# Patient Record
Sex: Female | Born: 1935 | Race: White | Hispanic: No | Smoking: Former smoker
Health system: Southern US, Community
[De-identification: ages and names within clinical notes are randomized; demographics above are authoritative.]

## PROBLEM LIST (undated history)

## (undated) DIAGNOSIS — F03A0 Unspecified dementia, mild, without behavioral disturbance, psychotic disturbance, mood disturbance, and anxiety: Secondary | ICD-10-CM

## (undated) DIAGNOSIS — F0390 Unspecified dementia without behavioral disturbance: Secondary | ICD-10-CM

## (undated) DIAGNOSIS — G2581 Restless legs syndrome: Secondary | ICD-10-CM

## (undated) DIAGNOSIS — I10 Essential (primary) hypertension: Secondary | ICD-10-CM

## (undated) DIAGNOSIS — I2510 Atherosclerotic heart disease of native coronary artery without angina pectoris: Secondary | ICD-10-CM

## (undated) DIAGNOSIS — E782 Mixed hyperlipidemia: Secondary | ICD-10-CM

## (undated) DIAGNOSIS — E039 Hypothyroidism, unspecified: Secondary | ICD-10-CM

## (undated) DIAGNOSIS — R42 Dizziness and giddiness: Secondary | ICD-10-CM

## (undated) HISTORY — PX: FOOT SURGERY: SHX648

## (undated) HISTORY — DX: Mixed hyperlipidemia: E78.2

## (undated) HISTORY — PX: EYE SURGERY: SHX253

## (undated) HISTORY — DX: Essential (primary) hypertension: I10

## (undated) HISTORY — DX: Hypothyroidism, unspecified: E03.9

## (undated) HISTORY — DX: Unspecified dementia without behavioral disturbance: F03.90

## (undated) HISTORY — DX: Dizziness and giddiness: R42

## (undated) HISTORY — DX: Unspecified dementia, mild, without behavioral disturbance, psychotic disturbance, mood disturbance, and anxiety: F03.A0

## (undated) HISTORY — DX: Atherosclerotic heart disease of native coronary artery without angina pectoris: I25.10

## (undated) HISTORY — DX: Restless legs syndrome: G25.81

---

## 2000-01-18 ENCOUNTER — Inpatient Hospital Stay (HOSPITAL_COMMUNITY): Admission: EM | Admit: 2000-01-18 | Discharge: 2000-01-19 | Payer: Self-pay | Admitting: Emergency Medicine

## 2000-01-18 ENCOUNTER — Encounter: Payer: Self-pay | Admitting: Emergency Medicine

## 2000-01-19 ENCOUNTER — Encounter: Payer: Self-pay | Admitting: *Deleted

## 2004-11-07 ENCOUNTER — Ambulatory Visit: Payer: Self-pay | Admitting: Ophthalmology

## 2004-11-14 ENCOUNTER — Ambulatory Visit: Payer: Self-pay | Admitting: Ophthalmology

## 2004-12-19 ENCOUNTER — Ambulatory Visit: Payer: Self-pay | Admitting: Ophthalmology

## 2005-04-18 ENCOUNTER — Ambulatory Visit: Payer: Self-pay | Admitting: Cardiology

## 2005-06-26 ENCOUNTER — Ambulatory Visit: Payer: Self-pay | Admitting: Internal Medicine

## 2005-12-03 ENCOUNTER — Ambulatory Visit: Payer: Self-pay | Admitting: Internal Medicine

## 2006-01-30 ENCOUNTER — Ambulatory Visit: Payer: Self-pay | Admitting: Cardiology

## 2006-02-14 ENCOUNTER — Ambulatory Visit: Payer: Self-pay

## 2006-02-22 ENCOUNTER — Ambulatory Visit: Payer: Self-pay | Admitting: Cardiology

## 2006-08-11 ENCOUNTER — Emergency Department: Payer: Self-pay | Admitting: Unknown Physician Specialty

## 2006-09-02 ENCOUNTER — Ambulatory Visit: Payer: Self-pay | Admitting: Internal Medicine

## 2007-01-21 ENCOUNTER — Ambulatory Visit: Payer: Self-pay | Admitting: Cardiology

## 2007-10-13 ENCOUNTER — Ambulatory Visit: Payer: Self-pay | Admitting: Cardiology

## 2007-12-09 ENCOUNTER — Ambulatory Visit: Payer: Self-pay | Admitting: Cardiology

## 2007-12-26 ENCOUNTER — Ambulatory Visit: Payer: Self-pay

## 2007-12-26 ENCOUNTER — Encounter: Payer: Self-pay | Admitting: Cardiology

## 2008-03-08 ENCOUNTER — Emergency Department: Payer: Self-pay | Admitting: Unknown Physician Specialty

## 2008-07-12 ENCOUNTER — Ambulatory Visit: Payer: Self-pay | Admitting: Unknown Physician Specialty

## 2008-08-04 ENCOUNTER — Ambulatory Visit: Payer: Self-pay | Admitting: Cardiovascular Disease

## 2008-12-28 ENCOUNTER — Ambulatory Visit: Payer: Self-pay

## 2009-01-31 ENCOUNTER — Ambulatory Visit: Payer: Self-pay | Admitting: Cardiovascular Disease

## 2009-08-17 DIAGNOSIS — I2510 Atherosclerotic heart disease of native coronary artery without angina pectoris: Secondary | ICD-10-CM | POA: Insufficient documentation

## 2009-08-17 DIAGNOSIS — I10 Essential (primary) hypertension: Secondary | ICD-10-CM | POA: Insufficient documentation

## 2009-08-17 DIAGNOSIS — E785 Hyperlipidemia, unspecified: Secondary | ICD-10-CM | POA: Insufficient documentation

## 2009-10-07 ENCOUNTER — Encounter: Payer: Self-pay | Admitting: Cardiovascular Disease

## 2010-01-10 ENCOUNTER — Encounter: Payer: Self-pay | Admitting: Cardiology

## 2010-01-10 DIAGNOSIS — I6529 Occlusion and stenosis of unspecified carotid artery: Secondary | ICD-10-CM | POA: Insufficient documentation

## 2010-01-12 ENCOUNTER — Encounter: Payer: Self-pay | Admitting: Cardiology

## 2010-01-12 ENCOUNTER — Ambulatory Visit: Payer: Self-pay

## 2010-02-22 ENCOUNTER — Ambulatory Visit: Payer: Self-pay | Admitting: Cardiovascular Disease

## 2010-05-09 ENCOUNTER — Ambulatory Visit: Payer: Self-pay | Admitting: Cardiovascular Disease

## 2010-05-09 DIAGNOSIS — R079 Chest pain, unspecified: Secondary | ICD-10-CM | POA: Insufficient documentation

## 2010-11-26 LAB — CONVERTED CEMR LAB
Total CK: 172 units/L (ref 7–177)
Troponin I: 0.01 ng/mL (ref <0.06)

## 2010-11-28 NOTE — Miscellaneous (Signed)
Summary: Orders Update  Clinical Lists Changes  Problems: Added new problem of CAROTID ARTERY STENOSIS (ICD-433.10) Orders: Added new Test order of Carotid Duplex (Carotid Duplex) - Signed 

## 2010-11-28 NOTE — Assessment & Plan Note (Signed)
Summary: f1y  Medications Added RAMIPRIL 5 MG CAPS (RAMIPRIL) Take 1 tablet by mouth once daily SYNTHROID 75 MCG TABS (LEVOTHYROXINE SODIUM) Take 1 tablet by mouth once daily NITRO-DUR 0.4 MG/HR PT24 (NITROGLYCERIN) Take 1 tablet by mouth as needed ASPIRIN 81 MG TBEC (ASPIRIN) Take one tablet by mouth daily      Allergies Added: NKDA  Visit Type:  Follow-up Primary Provider:  Alonna Buckler, MD  CC:  Patient is not having any SOB, Chest pain or discomfort, and or no edema.  History of Present Illness: Carol Beck is a pleasant 77 year old Caucasian female with the past medical history significant for nonobstructive coronary artery disease by left heart catheterization in 2000, hypertension, hyperlipidemia, hypothyroidism, restless leg syndrome who is here today for cardiac follow up.   Carol Beck tells me that she has been doing well from her cardiovascular standpoint.  She denies having had any episodes of exertional chest pain or dyspnea.  She is a very active individual. She did feel some palpitations while she had a heating pad on her chest a few weeks ago. Only lasted for a few seconds.  She has been checking her BP at home and it has been ok.   Current Medications (verified): 1)  Ramipril 5 Mg Caps (Ramipril) .... Take 1 Tablet By Mouth Once Daily 2)  Synthroid 75 Mcg Tabs (Levothyroxine Sodium) .... Take 1 Tablet By Mouth Once Daily 3)  Nitro-Dur 0.4 Mg/hr Pt24 (Nitroglycerin) .... Take 1 Tablet By Mouth As Needed 4)  Aspirin 81 Mg Tbec (Aspirin) .... Take One Tablet By Mouth Daily  Allergies (verified): No Known Drug Allergies  Past History:  Past Medical History: Reviewed history from 08/17/2009 and no changes required. HYPERTENSION, UNSPECIFIED (ICD-401.9) HYPERLIPIDEMIA-MIXED (ICD-272.4) CAD, NATIVE VESSEL (ICD-414.01) hypothyroidism restless leg syndrome  Social History: Reviewed history from 08/17/2009 and no changes required. Retired-school teacher Tobacco Use -  Former.  Alcohol Use - no Regular Exercise - yes Drug Use - no  Review of Systems       The patient complains of palpitations.  The patient denies fatigue, malaise, fever, weight gain/loss, vision loss, decreased hearing, hoarseness, chest pain, shortness of breath, prolonged cough, wheezing, sleep apnea, coughing up blood, abdominal pain, blood in stool, nausea, vomiting, diarrhea, heartburn, incontinence, blood in urine, muscle weakness, joint pain, leg swelling, rash, skin lesions, headache, fainting, dizziness, depression, anxiety, enlarged lymph nodes, easy bruising or bleeding, and environmental allergies.    Vital Signs:  Patient profile:   77 year old female Height:      62 inches Weight:      130.25 pounds BMI:     23.91 Pulse rate:   65 / minute BP sitting:   150 / 50  (left arm) Cuff size:   large  Physical Exam  General:  General: Well developed, well nourished, NAD HEENT: OP clear, mucus membranes moist Musculoskeletal: Muscle strength 5/5 all ext Psychiatric: Mood and affect normal Neck: No JVD, no carotid bruits, no thyromegaly, no lymphadenopathy. Lungs:Clear bilaterally, no wheezes, rhonci, crackles CV: RRR no murmurs, gallops rubs Abdomen: soft, NT, ND, BS present Extremities: No edema, pulses 2+.    EKG  Procedure date:  02/22/2010  Findings:      NSR, rate 65 bpm. Normal EKG.   Carotid Doppler  Procedure date:  01/12/2010  Findings:      40-59% bilateral. Stable  Impression & Recommendations:  Problem # 1:  CAD, NATIVE VESSEL (ICD-414.01) Stable. Continue current meds.  Her updated medication list  for this problem includes:    Ramipril 5 Mg Caps (Ramipril) .Marland Kitchen... Take 1 tablet by mouth once daily    Nitro-dur 0.4 Mg/hr Pt24 (Nitroglycerin) .Marland Kitchen... Take 1 tablet by mouth as needed    Aspirin 81 Mg Tbec (Aspirin) .Marland Kitchen... Take one tablet by mouth daily  Problem # 2:  HYPERTENSION, UNSPECIFIED (ICD-401.9) Slightly elevated today. She rushed over  for her visit and was anxious. No changes.   Her updated medication list for this problem includes:    Ramipril 5 Mg Caps (Ramipril) .Marland Kitchen... Take 1 tablet by mouth once daily    Aspirin 81 Mg Tbec (Aspirin) .Marland Kitchen... Take one tablet by mouth daily  Problem # 3:  CAROTID ARTERY STENOSIS (ICD-433.10) Stable disease. Repeat carotid dopplers one year.   Her updated medication list for this problem includes:    Aspirin 81 Mg Tbec (Aspirin) .Marland Kitchen... Take one tablet by mouth daily  Other Orders: Carotid Duplex (Carotid Duplex)  Patient Instructions: 1)  Your physician recommends that you schedule a follow-up appointment in: 1 year 2)  Your physician has requested that you have a carotid duplex. This test is an ultrasound of the carotid arteries in your neck. It looks at blood flow through these arteries that supply the brain with blood. Allow one hour for this exam. There are no restrictions or special instructions. in 1 year before follow up.

## 2010-11-28 NOTE — Assessment & Plan Note (Signed)
Summary: ROV      Allergies Added: NKDA  Visit Type:  Follow-up Primary Provider:  Alonna Buckler, MD  CC:  c/o chest pain during the night that woke her up.Carol Beck  History of Present Illness:  77 year old Caucasian female with the pmh significant for nonobstructive coronary artery disease by left heart catheterization in 2000, hypertension, hyperlipidemia, hypothyroidism, restless leg syndrome Who presents for an episode of chest pain last night.  She reports that she was woken from her sleep at 2 or 3 in the morning with 4/10 chest pain. He was on the central to left side of her chest above her breast. She was uncertain if it was due to indigestion or cardiac. Since then she's had no further episodes. It resolved after several minutes. She took several aspirin or resolved.  This morning she wanted to go for a prior to her appointment and she walked quickly around the building and had no symptoms of chest discomfort. Typically she is very active with no symptoms.  EKG shows normal sinus rhythm with rate of 70 beats per minute, no significant ST or T wave changes.  Current Medications (verified): 1)  Ramipril 5 Mg Caps (Ramipril) .... Take 1 Tablet By Mouth Once Daily 2)  Synthroid 75 Mcg Tabs (Levothyroxine Sodium) .... Take 1 Tablet By Mouth Once Daily 3)  Nitro-Dur 0.4 Mg/hr Pt24 (Nitroglycerin) .... Take 1 Tablet By Mouth As Needed 4)  Aspirin 81 Mg Tbec (Aspirin) .... Take One Tablet By Mouth Daily  Allergies (verified): No Known Drug Allergies  Past History:  Past Medical History: Last updated: 08/17/2009 HYPERTENSION, UNSPECIFIED (ICD-401.9) HYPERLIPIDEMIA-MIXED (ICD-272.4) CAD, NATIVE VESSEL (ICD-414.01) hypothyroidism restless leg syndrome  Past Surgical History: Last updated: 08/17/2009 eye  foot  Family History: Last updated: 08/17/2009 heart disease  Social History: Last updated: 02/22/2010 Retired-school teacher Tobacco Use - Former.  Alcohol Use - no Regular  Exercise - yes Drug Use - no  Risk Factors: Exercise: yes (08/17/2009)  Risk Factors: Smoking Status: quit (08/17/2009)  Review of Systems       The patient complains of chest pain.  The patient denies fever, weight loss, weight gain, vision loss, decreased hearing, hoarseness, syncope, dyspnea on exertion, peripheral edema, prolonged cough, abdominal pain, incontinence, muscle weakness, depression, and enlarged lymph nodes.    Vital Signs:  Patient profile:   77 year old female Height:      62 inches Weight:      129 pounds BMI:     23.68 Pulse rate:   77 / minute BP sitting:   157 / 67  (left arm) Cuff size:   regular  Vitals Entered By: Bishop Dublin, CMA (May 09, 2010 9:57 AM)  Physical Exam  General:  Well developed, well nourished, in no acute distress. Head:  normocephalic and atraumatic Neck:  Neck supple, no JVD. No masses, thyromegaly or abnormal cervical nodes. Chest Wall:  no deformities or breast masses noted Heart:  Non-displaced PMI, chest non-tender; regular rate and rhythm, S1, S2 with II/VI murmurs RSB, no rubs or gallops. Carotid upstroke normal, no bruit.  Pedals normal pulses. No edema, no varicosities. Abdomen:  Bowel sounds positive; abdomen soft and non-tender without masses Msk:  Back normal, normal gait. Muscle strength and tone normal. Pulses:  pulses normal in all 4 extremities Extremities:  No clubbing or cyanosis. Neurologic:  Alert and oriented x 3. Skin:  Intact without lesions or rashes. Psych:  Normal affect.   Impression & Recommendations:  Problem # 1:  CHEST PAIN UNSPECIFIED (ICD-786.50) etiology of her chest pain is uncertain. She has no significant EKG changes, currently symptom free. She was exercising before her appointment this morning and walking around the building on the outside for exercise with no symptoms. She does have nonobstructive disease by catheterization 10 years ago but does have moderate bilateral carotid arterial  disease. She is currently not on a statin. We have suggested to her that we will check some cardiac enzymes today and monitor her. If she has any further episodes, we will perform a stress test as her last stress test was 2009 which was negative.  Her updated medication list for this problem includes:    Ramipril 5 Mg Caps (Ramipril) .Carol Beck... Take 1 tablet by mouth once daily    Nitro-dur 0.4 Mg/hr Pt24 (Nitroglycerin) .Carol Beck... Take 1 tablet by mouth as needed    Aspirin 81 Mg Tbec (Aspirin) .Carol Beck... Take one tablet by mouth daily  Orders: T-CK Total [phone]) T-CK MB Fract ([id number]) T-Troponin I ([id number])  Problem # 2:  HYPERLIPIDEMIA-MIXED (ICD-272.4) We'll try to obtain her most recent lipid panel from Dr. Randa Lynn. Her goal LDL should be less than 70 given her moderate bilateral carotid arterial disease  Orders: T-CK Total [id number]) T-CK MB Fract ([id number]) T-Troponin I ([id number])  Problem # 3:  HYPERTENSION, UNSPECIFIED (ICD-401.9) Blood pressure is mildly elevated and we'll continue to monitor this and adjust her medications accordingly.  Her updated medication list for this problem includes:    Ramipril 5 Mg Caps (Ramipril) .Carol Beck... Take 1 tablet by mouth once daily    Aspirin 81 Mg Tbec (Aspirin) .Carol Beck... Take one tablet by mouth daily  Other Orders: EKG w/ Interpretation (93000)  Patient Instructions: 1)  Your physician recommends that you have lab work today (troponin/CK/CK-MB) 2)  Your physician recommends that you continue on your current medications as directed. Please refer to the Current Medication list given to you today. 3)  Your physician wants you to follow-up in:   6 month You will receive a reminder letter in the mail two months in advance. If you don't receive a letter, please call our office to schedule the follow-up appointment.

## 2011-01-30 ENCOUNTER — Other Ambulatory Visit: Payer: Self-pay | Admitting: Cardiology

## 2011-01-30 ENCOUNTER — Encounter (INDEPENDENT_AMBULATORY_CARE_PROVIDER_SITE_OTHER): Payer: Medicare Other | Admitting: *Deleted

## 2011-01-30 DIAGNOSIS — I6529 Occlusion and stenosis of unspecified carotid artery: Secondary | ICD-10-CM

## 2011-03-13 NOTE — Assessment & Plan Note (Signed)
[facility] OFFICE NOTE   ASHERAH, LAVOY                      MRN:          [id number]  DATE:12/09/2007                            DOB:          12/15/1926    Mrs. Christell Constant is a pleasant female who has a history of moderate coronary  disease by previous catheterization.  Please refer to my note of  October 13, 2007, for details.  When I last saw her, she was  complaining of increased dyspnea on exertion but there was no orthopnea  or PND and there was no pedal edema.  She was not having chest pain.  We  were concerned that this may be an anginal annual equivalent.  We  scheduled a Myoview but she cancelled this twice.  She returns today to  discuss this.  She is concerned about x-ray exposure.  She has had  continuing dyspnea on exertion.   MEDICATION:  1. Synthroid 0.075 mg p.o. daily.  2. Altace 5 mg p.o. daily.  3. Fish oil.  4. Calcium.  5. Aspirin 81 mg p.o. daily.   PHYSICAL EXAMINATION:  VITAL SIGNS:  Blood pressure 122/72 and pulse is  64.  She weighs 129 pounds.  HEENT:  Normal.  NECK:  Supple.  CHEST:  Clear.  CARDIOVASCULAR:  Regular rate.  ABDOMEN:  Exam shows no tenderness.  EXTREMITIES:  No edema.   DIAGNOSES:  1. Dyspnea - Mr. Vanloan is concerned about the x-ray exposure.  We      will therefore schedule her to have a stress echocardiogram for      risk stratification.  If this shows and no abnormalities, we will      continue medical therapy.  2. Coronary disease - she will continue on her aspirin and ACE      inhibitor.  Note, she has not tolerated any cholesterol medicines      in the past.  3. 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  4. Hypertension - her blood pressure is controlled on her present      medications.  5. Hyperlipidemia - she will continue with her diet.  6. Cerebrovascular disease - she will have carotid Dopplers in April      for follow-up  disease.   I will see back in six months.     Madolyn Frieze [name], MD, [facility]  Electronically Signed    BSC/MedQ  DD: 12/09/2007  DT: 12/10/2007  Job #: [id number]   cc:   Fidela Juneau, M.D.

## 2011-03-13 NOTE — Assessment & Plan Note (Signed)
[facility]                            [place] OFFICE NOTE   Carol, Beck                      MRN:          [id number]  DATE:08/04/2008                            DOB:          1926-12-28    HISTORY OF PRESENT ILLNESS:  Carol Beck is a pleasant 77 year old  Caucasian female with a past medical history significant for  nonobstructive coronary artery disease, hypertension, hyperlipidemia,  hypothyroidism, and restless leg syndrome who was previously been  followed in this office by Dr. Olga Millers.  Dr. [name] was no  longer coming to the [facility] and because of this, Carol Beck  is here to establish care with me today.  She tells me that she has been  doing well since her last visit in this office which was in February  2009.  She does described to me one episode of chest pain while she was  sitting and watching TV at night.  This was described as dull ache that  persisted for several minutes until she took an aspirin.  Chest pain  completely resolved and it was not associated with any radiation,  shortness of breath, palpitations, dizziness, near syncope, syncope,  nausea, or vomiting.  She has had no recurrence of this chest pain over  the last several months.  She has had some dizziness and told me that  she has had an extensive inner ear workup that has all been negative.  Her dizziness seems to occur when she makes sudden movements of her  head.  She does not described to me any exertional shortness of breath  and denies any lower extremity edema, orthopnea, or paroxysmal nocturnal  dyspnea.  As stated in prior notes, she continues to complain of feeling  her heart beat in her ear.   PAST MEDICAL HISTORY.:  1. Hypothyroidism.  2. Nonobstructive coronary artery disease by left heart      catheterization in 2000.  3. Hypertension.  4. Hyperlipidemia.  5. Restless leg syndrome.   PAST SURGICAL HISTORY:  1. Multiple  eye surgeries.  2. Removal of a Morton's neuroma from her left foot.   ALLERGIES:  FOSAMAX and SHELLFISH.   CURRENT MEDICATIONS:  1. Synthroid 75 mcg once daily.  2. Altace 5 mg once daily.  3. Aspirin 81 mg once daily.  4. Pravastatin 20 mg once daily.   SOCIAL HISTORY:  The patient remotely use tobacco, but has not smoked in  42 years.  She denies use of alcohol or illicit drugs.  She has 4  children.  She is retired.   FAMILY HISTORY:  The patient's mother died at age 42 from a myocardial  infarction.  Her brother died at age of 79 and had known coronary artery  disease with a prior CABG, her father died from tuberculosis.   REVIEW OF SYSTEMS:  As stated in history of present illness is otherwise  negative.   PHYSICAL EXAMINATION:  GENERAL:  She is a pleasant elderly and Caucasian  female in no acute distress.  VITAL SIGNS:  Blood pressure 136/64, pulse 62 and regular, respirations  12 and unlabored.  NECK:  No JVD.  No carotid bruits.  No thyromegaly, no lymphadenopathy.  SKIN:  Warm and dry.  Oropharynx clear.  Mucous membranes moist.  LUNGS:  Clear to auscultation bilaterally without wheezes, rhonchi, or  crackles noted.  CARDIOVASCULAR:  Regular rate and rhythm without murmurs, gallops, or  rubs noted.  ABDOMEN:  Soft, nontender.  Bowel sounds were present.  EXTREMITIES:  No evidence of edema.  Pulses are 2+ in all extremities.  NEUROLOGICAL:  No focal neurological deficits.   DIAGNOSTICS STUDIES.:  A 12-lead electrocardiogram obtained in our  office today shows normal sinus rhythm with no ischemic changes.  All  intervals are within normal limits.   ASSESSMENT AND PLAN:  This is a pleasant 77 year old Caucasian female  with a history of known nonobstructive coronary artery disease,  hypertension, hyperlipidemia, hypothyroidism, restless leg syndrome who  presents for routine cardiac visit.  As stated above, her care is being  assumed in my office today since Dr.  [name] who is her primary  cardiologist no longer sees patients in the [facility].  In  regards to her coronary artery disease, I will continue her aspirin and  ACE inhibitor.  She also will be continued on pravastatin.  The patient  has a history of not tolerating other statin medications; however, she  is to be doing well on this medication currently.  She did have a stress  echocardiogram performed December 26, 2007, during which she exercised  for 8 minutes and had no evidence of stress-induced ischemia on the  echocardiographic images.  There were some EKG changes; however, these  were considered false positive as her images were normal.  She also had  an echocardiogram performed in February that showed normal left  ventricular size and normal systolic function.  There was mild  calcification of the aortic valve and trivial aortic valve  regurgitation.  Her blood pressure is adequately controlled today and at  this time I will make no medication changes.  We will plan on continuing  her pravastatin for her hyperlipidemia and we will check a fasting lipid  profile at her next visit if this has not been done in her primary care  office.  She also has known cerebrovascular disease and has carotid  Doppler studies performed every 2 years.  Her most recent ones were  performed this year.  We will schedule this at her next visit.  I will  plan on seeing her back in this office in approximately 6 months.     Verne Carrow, MD  Electronically Signed    CM/MedQ  DD: 08/04/2008  DT: 08/05/2008  Job #: [id number]

## 2011-03-13 NOTE — Assessment & Plan Note (Signed)
[facility] OFFICE NOTE   Carol, Beck                      MRN:          [id number]  DATE:01/31/2009                            DOB:          1927-01-18    PRIMARY CARE PHYSICIAN:  Dr. Alonna Buckler   HISTORY OF PRESENT ILLNESS:  Carol Beck is a pleasant 77 year old  Caucasian female with the past medical history significant for  nonobstructive coronary artery disease by left heart catheterization in  2000, hypertension, hyperlipidemia, hypothyroidism, restless leg  syndrome who was initially seen in my office in October 2009 to  establish cardiology care.  She had previously been followed in this  office by my partner Dr. Olga Millers who is no longer seeing patients  in this office.  [name] tells me that she has been doing well from  her cardiovascular standpoint.  She denies having had any episodes of  exertional chest pain or dyspnea.  She does state that on occasion while  she is sitting inside the house, she notices sharp twinges of epigastric  pain, which only lasts for a few seconds.  These pains are not  associated with any palpitations, dizziness, diaphoresis, nausea,  vomiting, shortness of breath, or any radiation into her chest, neck,  jaw, or arm.  These episodes only occur every now and then.  She is a  very active individual who tells me that she recently applied a code of  stain and sealer to her deck last weekend and had no problems with  dyspnea or chest pain while performing these activities.   PAST MEDICAL HISTORY:  1. Nonobstructive coronary artery disease by left heart      catheterization in 2000.  2. Hypothyroidism.  3. Hypertension.  4. Hyperlipidemia.  5. Restless leg syndrome.   CURRENT MEDICATIONS:  1. Synthroid 0.075 mg once daily.  2. Altace 5 mg once daily.  3. Pravastatin 20 mg once daily.  4. Aspirin 81 mg per day and occasionally using 325 mg per day.   REVIEW OF SYMPTOMS:  As stated above in the HPI and otherwise negative.   PHYSICAL EXAMINATION:  VITAL SIGNS:  Blood pressure 124/67, pulse 64 and  regular, respirations 12 and unlabored.  GENERAL:  She is a pleasant, elderly Caucasian female in no acute  distress.  She is alert and oriented x3.  SKIN:  Warm and dry.  HEENT:  Normal.  NECK:  No JVD.  No carotid bruits.  No thyromegaly.  No lymphadenopathy.  LUNGS:  Clear to auscultation bilaterally without wheezes, rhonchi, or  crackles noted.  CARDIOVASCULAR:  Regular rate and rhythm without murmurs, gallops, or  rubs noted.  ABDOMEN:  Soft, nontender.  Bowel sounds are present.  EXTREMITIES:  No evidence of edema.  Pulses are 2+ in all extremities.   DIAGNOSTIC STUDIES:  1. A 12-lead EKG obtained in our office today shows normal sinus      rhythm with no ischemic changes.  The ventricular rate is 64 beats      per minute.  2. Bilateral carotid artery Doppler studies performed on  December 28, 2008, shows stable carotid artery disease with 40-59% right      internal carotid artery stenosis, 0-39% left internal carotid      artery stenosis.   ASSESSMENT/PLAN:  This is a pleasant 77 year old Caucasian female with a  history of known nonobstructive coronary artery disease, hypertension,  hyperlipidemia, hypothyroidism, restless leg syndrome who presents today  for routine cardiac followup.  She is asymptomatic with her current  medical therapy.  She is somewhat skeptical about taking a statin  medication; however, states that she will continue taking this as she  knows it will be good for her heart and her overall vascular health.  She has had a recent ischemic evaluation in February 2009, during which  her stress echocardiogram had no evidence of stress-induced ischemia.  Her recent echocardiogram in February 2009, showed normal left  ventricular size and normal systolic function.  She had mild  calcification in her aortic valve and  trivial aortic valve  regurgitation.  Her blood pressure is currently very well controlled.  I  will not make any changes at the current time.  Her cholesterol is  followed closely in the office of Dr. Randa Lynn.  I have reviewed recent  results and see that her LDL is at 94.  The goal for her LDL would be  around 70 with her history of nonobstructive coronary artery disease, as  well as her history of carotid artery disease.  She is not willing to  increase the dose of her statin medication at this time.  I would like  to see her back in my office in 6 months.  We will repeat her carotid  artery Doppler studies in 1 year.     Verne Carrow, MD  Electronically Signed    CM/MedQ  DD: 01/31/2009  DT: 02/01/2009  Job #: [id number]   cc:   Dr. Alonna Buckler

## 2011-03-13 NOTE — Assessment & Plan Note (Signed)
[facility] OFFICE NOTE   Carol Beck, Carol Beck                      MRN:          [id number]  DATE:10/13/2007                            DOB:          06/15/1927    Carol Beck is a pleasant female who has a history of moderate coronary  disease by previous catheterization.  Since I last saw her she notes  that she has had increased dyspnea on exertion with more extreme  activities, but not with routine activities.  However, there is no  orthopnea, PND, pedal edema, palpitations, presyncope, syncope or chest  pain.  She does state that she can feel her heart beat in her ear when  she lays down at night.   MEDICATIONS:  1. Synthroid 0.075 mg p.o. daily.  2. Altace 5 mg p.o. daily.  3. Fish oil 1000 mg p.o. t.i.d.  4. Calcium.   She did try Pravachol but it did cause some aches.  She states that she  has been intermittently taking her aspirin due to some stomach  discomfort.   PHYSICAL EXAMINATION:  Shows a blood pressure of 142/73 and her pulse is  67, She weighs 125 pounds.  HEENT:  Normal.  NECK:  Supple, there is a left carotid bruit noted.  CHEST:  Clear.  CARDIOVASCULAR:  Reveals a regular rate and rhythm.  ABDOMINAL:  Shows no tenderness.  EXTREMITIES:  Show no edema.   An electrocardiogram shows a sinus rhythm at a rate of 67.  The axis is  normal.  There are no significant ST changes.   DIAGNOSES:  1. Dyspnea -- Carol Beck has noted increased dyspnea on exertion since      I saw her in March.  We will plan to proceed with a Myoview for      risk stratification.  If it shows normal perfusion then we will      continue with medical therapy.  2. Coronary artery disease -- I have instructed her to take aspirin 81      mg daily (enteric coated).  She will also continue on her      angiotensin converting enzyme inhibitor.  Note, we have decided not      to continue with the statin as it did cause  some myalgias.  She has      also had history of drug induced hepatitis, possibly felt secondary      to Lipitor and a PREVIOUS INTOLERANCE TO ZOCOR.  3. History of drug induced hepatitis -- Felt secondary to Lipitor.  4. Hypertension -- We will track her blood pressure and increase her      medications as indicated.  5. Hyperlipidemia -- She has not tolerated any of the cholesterol      medicines.  Therefore we will continue with diet.  6. Cerebrovascular disease -- She will need followup carotid Dopplers      in April of 2009.   I will see her back in approximately 6 months or sooner if necessary.     Madolyn Frieze [name], MD, [facility]  Electronically Signed  BSC/MedQ  DD: 10/13/2007  DT: 10/13/2007  Job #: [id number]   cc:   Fidela Juneau, MD

## 2011-03-16 NOTE — Discharge Summary (Signed)
[place]  Patient:    Carol Beck, Carol Beck                        MRN: Adm. Date:  01/18/00 Disc. Date: 01/19/00 Attending:  Daisey Must, M.D. [facility] Dictator:   Gene Serpe, P.A. CC:         Dr. Fidela Juneau of the [facility]                  Referring Physician Discharge [place]  PROCEDURES:  Exercise Cardiolite.  REASON FOR ADMISSION:  Patient is a 77 year old female, with prior history of nonobstructive CAD by coronary angiogram February 2000, who presented with chest discomfort associated with bowel movement.  She was admitted for rule out MI and further diagnostic evaluation.  LABORATORY DATA:  CPK-MB negative x 3, troponin I less than 0.03, 0.06. Metabolic profile and CBC within normal limits.  LFTs - mildly elevated total bilirubin at 1.4, SGOT 47.  Admission CXR - NED.  HOSPITAL COURSE:  Patient ruled out for MI with negative serial cardiac enzymes.  Exercise Cardiolite performed March 23; patient exercised six minutes, standard  Bruce protocol (85% PMHR).  No chest pain was elicited - no significant ST abnormalities noted.  Cardiolite perfusion images revealed normal scan with question of LVH; EF 77%.  Patient was cleared for discharge.  No medication adjustments made.  Plan is for patient to return to primary care physician for further evaluation.  DISCHARGE MEDICATIONS: 1. Baycol 0.4 mg q.d. 2. Synthroid 0.088 mg q.d. 3. Triamterene/HCTZ 37.5/25 mg q.d. 4. Aciphex 20 mg p.r.n. 5. Aspirin 81 mg q.d. 6. Imdur 30 mg q.d.  DIET:  Patient is to maintain low fat/cholesterol diet.  FOLLOW-UP:  She is to schedule a follow-up appointment with her primary care physician, Dr. Fidela Juneau, in the ensuing few weeks.  DISCHARGE DIAGNOSES: 1. Noncardiac chest pain.    a. Normal exercise Cardiolite; ejection fraction 77% - January 19, 2000.    b. Nonobstructive coronary artery disease - cardiac catheterization       February  2000. 2. Gastroesophageal reflux disease. 3. Treated hypothyroidism. 4. Hypertension. 5. Hyperlipidemia. 6. Mildly elevated liver function tests. DD:  01/19/00 TD:  01/20/00 Job: [id number]

## 2011-03-16 NOTE — Assessment & Plan Note (Signed)
[name]                            CARDIOLOGY OFFICE NOTE   Carol, Beck                      MRN:          [id number]  DATE:01/21/2007                            DOB:          01-21-1927    Carol Beck returns for followup today.  She is a pleasant female who  has a history of moderate coronary disease by previous catheterization.  Her most recent Myoview was performed on February 14, 2006.  Her ejection  fraction was 80% and there was no ischemia or scar.  She had her last  carotid Doppler's on February 14, 2006 that showed 40% to 59% bilaterally  and follow up was recommended in 2 years.  Also of note she had a  previous abdominal ultrasound in October 2004 that showed no aneurysm.  Since I last saw her she is doing well.  She walks 3 to 5 times per  week, up to an hour at a time.  She does not have dyspnea on exertion,  orthopnea, PND, pedal edema, palpitations, presyncope, syncope or  exertional chest pain.  She does not smoke and she is trying to follow a  diet.   MEDICATIONS:  1. Synthroid 0.75 mg p.o. daily.  2. Zetia 10 mg p.o. daily.  3. Aspirin 81 mg p.o. daily.  4. Altace 5 mg p.o. daily.  5. Fish oil 1000 mg p.o. t.i.d.  6. Calcium.  7. Multivitamin.   PHYSICAL EXAMINATION:  VITALS:  Today shows a blood pressure of 128/64  and her pulse is 64.  She weighs 125 pounds.  NECK:  Her neck is supple.  CHEST:  Her chest is clear.  HEART:  Her cardiovascular exam is regular rate and rhythm.  EXTREMITIES:  Show no edema.   Recent laboratories show normal liver functions and an LDL of 103.   Her electrocardiogram shows a sinus rhythm with no significant ST  changes.   DIAGNOSES:  1. Coronary artery disease.  2. History of mild to moderate cerebral vascular disease.  3. History of drug induced hepatitis, felt secondary to Lipitor.  4. Hypertension.  5. Hyperlipidemia.  6. History of previous intolerance to Zocor.   PLAN:   [name] is doing well from a symptomatic standpoint.  She  will continue with diet and exercise and note that she does not smoke.  We had long discussions today concerning statins.  I would like for her  to be on one of these if possible.  We certainly need to follow her  liver functions closely.  Note they are normal at baseline by recent  laboratories drawn at __________.  We will try Pravachol 20 mg p.o.  daily.  I will check lipids and liver in 4 weeks and adjust her  medications as indicated.  We will follow her liver functions closely in  the future.  She will need follow up carotid Doppler's in April of 2009.  I will see her back in 6 months.     Madolyn Frieze [name], MD, [facility]  Electronically Signed    BSC/MedQ  DD: 01/21/2007  DT: 01/21/2007  Job #: [id number]   cc:   Fidela Juneau

## 2011-03-21 ENCOUNTER — Telehealth: Payer: Self-pay | Admitting: Cardiovascular Disease

## 2011-03-21 NOTE — Telephone Encounter (Signed)
Left message with spouse to have the pt call to schedule f/u appt with [facility].  Pt had Carotid in April.

## 2011-04-13 ENCOUNTER — Encounter: Payer: Self-pay | Admitting: Cardiovascular Disease

## 2012-04-14 ENCOUNTER — Encounter: Payer: Self-pay | Admitting: Internal Medicine

## 2012-04-28 ENCOUNTER — Encounter: Payer: Self-pay | Admitting: Internal Medicine

## 2012-05-29 ENCOUNTER — Encounter: Payer: Self-pay | Admitting: Internal Medicine

## 2012-06-29 ENCOUNTER — Encounter: Payer: Self-pay | Admitting: Internal Medicine

## 2012-07-29 ENCOUNTER — Encounter: Payer: Self-pay | Admitting: Internal Medicine

## 2012-08-29 ENCOUNTER — Encounter: Payer: Self-pay | Admitting: Internal Medicine

## 2012-09-28 ENCOUNTER — Encounter: Payer: Self-pay | Admitting: Internal Medicine

## 2012-10-29 ENCOUNTER — Encounter: Payer: Self-pay | Admitting: Internal Medicine

## 2012-11-29 ENCOUNTER — Encounter: Payer: Self-pay | Admitting: Internal Medicine

## 2012-12-27 ENCOUNTER — Encounter: Payer: Self-pay | Admitting: Internal Medicine

## 2013-01-27 ENCOUNTER — Encounter: Payer: Self-pay | Admitting: Internal Medicine

## 2013-02-18 ENCOUNTER — Encounter (INDEPENDENT_AMBULATORY_CARE_PROVIDER_SITE_OTHER): Payer: Medicare Other

## 2013-02-18 DIAGNOSIS — I6529 Occlusion and stenosis of unspecified carotid artery: Secondary | ICD-10-CM

## 2013-02-20 ENCOUNTER — Telehealth: Payer: Self-pay | Admitting: Cardiology

## 2013-02-20 NOTE — Telephone Encounter (Signed)
Spoke with pt husband, aware of carotid results. Copy mailed at their request

## 2013-02-20 NOTE — Telephone Encounter (Signed)
New Problem:    Patient's husband called in returning your call regarding his wife's latest Carotid results.  Please call back.

## 2013-02-26 ENCOUNTER — Encounter: Payer: Self-pay | Admitting: Internal Medicine

## 2013-03-29 ENCOUNTER — Encounter: Payer: Self-pay | Admitting: Internal Medicine

## 2013-04-23 LAB — CBC WITH DIFFERENTIAL/PLATELET
Basophil #: 0.1 10*3/uL (ref 0.0–0.1)
Basophil %: 1.4 %
Eosinophil #: 0.2 10*3/uL (ref 0.0–0.7)
Eosinophil %: 2.8 %
HCT: 35 % (ref 35.0–47.0)
HGB: 12.2 g/dL (ref 12.0–16.0)
Lymphocyte #: 1.8 10*3/uL (ref 1.0–3.6)
Lymphocyte %: 27.3 %
MCH: 29.8 pg (ref 26.0–34.0)
MCHC: 35 g/dL (ref 32.0–36.0)
MCV: 85 fL (ref 80–100)
Monocyte #: 0.4 x10 3/mm (ref 0.2–0.9)
Monocyte %: 6.1 %
Neutrophil #: 4.2 10*3/uL (ref 1.4–6.5)
Neutrophil %: 62.4 %
Platelet: 266 10*3/uL (ref 150–440)
RBC: 4.1 10*6/uL (ref 3.80–5.20)
RDW: 14.6 % — ABNORMAL HIGH (ref 11.5–14.5)
WBC: 6.7 10*3/uL (ref 3.6–11.0)

## 2013-04-23 LAB — COMPREHENSIVE METABOLIC PANEL
Albumin: 3.8 g/dL (ref 3.4–5.0)
Alkaline Phosphatase: 62 U/L (ref 50–136)
Anion Gap: 7 (ref 7–16)
BUN: 16 mg/dL (ref 7–18)
Bilirubin,Total: 0.6 mg/dL (ref 0.2–1.0)
Calcium, Total: 9.3 mg/dL (ref 8.5–10.1)
Chloride: 106 mmol/L (ref 98–107)
Co2: 27 mmol/L (ref 21–32)
Creatinine: 1.17 mg/dL (ref 0.60–1.30)
EGFR (African American): 50 — ABNORMAL LOW
EGFR (Non-African Amer.): 43 — ABNORMAL LOW
Glucose: 143 mg/dL — ABNORMAL HIGH (ref 65–99)
Osmolality: 283 (ref 275–301)
Potassium: 3.3 mmol/L — ABNORMAL LOW (ref 3.5–5.1)
SGOT(AST): 22 U/L (ref 15–37)
SGPT (ALT): 15 U/L (ref 12–78)
Sodium: 140 mmol/L (ref 136–145)
Total Protein: 7.5 g/dL (ref 6.4–8.2)

## 2013-04-23 LAB — LIPID PANEL
Cholesterol: 153 mg/dL (ref 0–200)
HDL Cholesterol: 55 mg/dL (ref 40–60)
Ldl Cholesterol, Calc: 79 mg/dL (ref 0–100)
Triglycerides: 97 mg/dL (ref 0–200)
VLDL Cholesterol, Calc: 19 mg/dL (ref 5–40)

## 2013-04-23 LAB — URINALYSIS, COMPLETE
Bacteria: NONE SEEN
Bilirubin,UR: NEGATIVE
Blood: NEGATIVE
Glucose,UR: NEGATIVE mg/dL (ref 0–75)
Ketone: NEGATIVE
Leukocyte Esterase: NEGATIVE
Nitrite: NEGATIVE
Ph: 6 (ref 4.5–8.0)
Protein: NEGATIVE
RBC,UR: <1 /HPF (ref 0–5)
Specific Gravity: 1.012 (ref 1.003–1.030)
Squamous Epithelial: <1
WBC UR: 1 /HPF (ref 0–5)

## 2013-04-23 LAB — TSH: Thyroid Stimulating Horm: 0.255 u[IU]/mL — ABNORMAL LOW

## 2013-04-28 ENCOUNTER — Encounter: Payer: Self-pay | Admitting: Internal Medicine

## 2013-05-13 ENCOUNTER — Ambulatory Visit (INDEPENDENT_AMBULATORY_CARE_PROVIDER_SITE_OTHER): Payer: Medicare Other | Admitting: Cardiovascular Disease

## 2013-05-13 ENCOUNTER — Encounter: Payer: Self-pay | Admitting: Cardiovascular Disease

## 2013-05-13 VITALS — BP 124/64 | HR 67 | Ht 62.5 in | Wt 125.5 lb

## 2013-05-13 DIAGNOSIS — I2510 Atherosclerotic heart disease of native coronary artery without angina pectoris: Secondary | ICD-10-CM

## 2013-05-13 DIAGNOSIS — R011 Cardiac murmur, unspecified: Secondary | ICD-10-CM | POA: Insufficient documentation

## 2013-05-13 DIAGNOSIS — E785 Hyperlipidemia, unspecified: Secondary | ICD-10-CM

## 2013-05-13 DIAGNOSIS — I6529 Occlusion and stenosis of unspecified carotid artery: Secondary | ICD-10-CM

## 2013-05-13 DIAGNOSIS — R42 Dizziness and giddiness: Secondary | ICD-10-CM

## 2013-05-13 DIAGNOSIS — I10 Essential (primary) hypertension: Secondary | ICD-10-CM

## 2013-05-13 NOTE — Patient Instructions (Addendum)
You are doing well. No medication changes were made.  Please call us if you have new issues that need to be addressed before your next appt.  Your physician wants you to follow-up in: 12 months.  You will receive a reminder letter in the mail two months in advance. If you don't receive a letter, please call our office to schedule the follow-up appointment. 

## 2013-05-13 NOTE — Assessment & Plan Note (Signed)
Etiology of her dizziness is uncertain. She's not orthostatic, no arrhythmia, EKG is normal. She does have a murmur but this would likely not contributing to chronic dizziness that has persisted for less than 2 weeks. I am concerned about other medications that she is on and perhaps she needs a neurology consult if symptoms persist. Notes indicate she is on Abilify, Celexa, Aricept. Meclizine was prescribed for dizziness but has not been started. Perhaps this might be of some benefit. No further cardiac workup needed at this time.

## 2013-05-13 NOTE — Assessment & Plan Note (Signed)
Encouraged her to stay on her pravastatin 

## 2013-05-13 NOTE — Progress Notes (Signed)
Patient ID: Carol Beck, female    DOB: 1926/12/09, 78 y.o.   MRN: [id number]  HPI Comments:  79 year old Caucasian female with the pmh of nonobstructive coronary artery disease by left heart catheterization in 2000, hypertension, hyperlipidemia, hypothyroidism, restless leg syndrome, previous episodes of chest pain in 2011 when last seen in the office in 2011 , presents for routine followup and for new symptoms of dizziness. Previous primary care physician was Dr. Randa Lynn.  She states that for the past week to 2 weeks, she has had dizziness. He does not be spinning of the room, rather"dizziness in her head". She denies any medication changes. Notes from [facility] indicate she has a history of delusions. She denies any significant shortness of breath or chest pain with exertion. Overall her balance is good. She denies any falls.   She has dizziness lying down, sitting up and standing up. In the exam room, she was dizzy sitting on the table. Symptoms did not get worse or better with standing.  Blood pressure was stable, systolic pressure in the 120 range.  EKG shows normal sinus rhythm with rate of 67 beats per minute, no significant ST or T wave changes.   Outpatient Encounter Prescriptions as of 05/13/2013  Medication Sig Dispense Refill  . ALPRAZolam (XANAX) 0.5 MG tablet Take 0.5 mg by mouth 3 (three) times daily as needed for sleep.      . ARIPiprazole (ABILIFY) 2 MG tablet Take 2 mg by mouth daily.      Marland Kitchen aspirin 81 MG tablet Take 81 mg by mouth daily.        . citalopram (CELEXA) 20 MG tablet Take 20 mg by mouth daily.      . diazepam (VALIUM) 5 MG tablet Take 5 mg by mouth as needed for anxiety.      . donepezil (ARICEPT) 10 MG tablet Take 10 mg by mouth at bedtime.      . fluticasone (CUTIVATE) 0.05 % cream Apply 1 application topically as needed.      . fluticasone (FLONASE) 50 MCG/ACT nasal spray Place 2 sprays into the nose daily.      . hydrochlorothiazide (HYDRODIURIL)  25 MG tablet Take 25 mg by mouth daily.      Marland Kitchen levothyroxine (SYNTHROID, LEVOTHROID) 50 MCG tablet Take 50 mcg by mouth daily before breakfast.      . loratadine (CLARITIN) 10 MG tablet Take 10 mg by mouth daily.      . meclizine (ANTIVERT) 25 MG tablet Take 25 mg by mouth 2 (two) times daily.      . potassium chloride (K-DUR) 10 MEQ tablet Take 10 mEq by mouth daily.      . pravastatin (PRAVACHOL) 20 MG tablet Take 20 mg by mouth daily.      . [DISCONTINUED] levothyroxine (SYNTHROID, LEVOTHROID) 75 MCG tablet Take 75 mcg by mouth daily.        . [DISCONTINUED] nitroGLYCERIN (NITRODUR - DOSED IN MG/24 HR) 0.4 mg/hr Place 1 patch onto the skin daily as needed.        . [DISCONTINUED] ramipril (ALTACE) 5 MG capsule Take 5 mg by mouth daily.         No facility-administered encounter medications on file as of 05/13/2013.     Review of Systems  Constitutional: Negative.   HENT: Negative.   Eyes: Negative.   Respiratory: Negative.   Cardiovascular: Negative.   Gastrointestinal: Negative.   Musculoskeletal: Negative.   Skin: Negative.   Neurological: Negative.  Psychiatric/Behavioral: Negative.   All other systems reviewed and are negative.    BP 124/64  Pulse 67  Ht 5' 2.5" (1.588 m)  Wt 125 lb 8 oz (56.926 kg)  BMI 22.57 kg/m2   Physical Exam  Nursing note and vitals reviewed. Constitutional: She is oriented to person, place, and time. She appears well-developed and well-nourished.  HENT:  Head: Normocephalic.  Nose: Nose normal.  Mouth/Throat: Oropharynx is clear and moist.  Eyes: Conjunctivae are normal. Pupils are equal, round, and reactive to light.  Neck: Normal range of motion. Neck supple. No JVD present.  Cardiovascular: Normal rate, regular rhythm, S1 normal, S2 normal, normal heart sounds and intact distal pulses.  Exam reveals no gallop and no friction rub.   No murmur heard. Pulmonary/Chest: Effort normal and breath sounds normal. No respiratory distress. She  has no wheezes. She has no rales. She exhibits no tenderness.  Abdominal: Soft. Bowel sounds are normal. She exhibits no distension. There is no tenderness.  Musculoskeletal: Normal range of motion. She exhibits no edema and no tenderness.  Lymphadenopathy:    She has no cervical adenopathy.  Neurological: She is alert and oriented to person, place, and time. Coordination normal.  Skin: Skin is warm and dry. No rash noted. No erythema.  Psychiatric: She has a normal mood and affect. Her behavior is normal. Judgment and thought content normal.    Assessment and Plan

## 2013-05-13 NOTE — Assessment & Plan Note (Signed)
Blood pressure is well controlled on today's visit. No changes made to the medications. Not orthostatic.

## 2013-05-13 NOTE — Assessment & Plan Note (Signed)
Likely has mild aortic valve stenosis. If murmur becomes more prominent in the next several years, could repeat an echocardiogram.

## 2013-05-13 NOTE — Assessment & Plan Note (Signed)
Currently with no symptoms of angina. No further workup at this time. Continue current medication regimen. 

## 2013-05-13 NOTE — Assessment & Plan Note (Signed)
40% bilateral carotid disease. Likely not contributing to her dizziness.

## 2013-05-29 ENCOUNTER — Encounter: Payer: Self-pay | Admitting: Internal Medicine

## 2013-06-04 LAB — TSH: Thyroid Stimulating Horm: 6.64 u[IU]/mL — ABNORMAL HIGH

## 2013-06-29 ENCOUNTER — Encounter: Payer: Self-pay | Admitting: Internal Medicine

## 2013-07-09 LAB — TSH: Thyroid Stimulating Horm: 5.18 u[IU]/mL — ABNORMAL HIGH

## 2013-07-29 ENCOUNTER — Encounter: Payer: Self-pay | Admitting: Internal Medicine

## 2013-08-29 ENCOUNTER — Encounter: Payer: Self-pay | Admitting: Internal Medicine

## 2013-09-28 ENCOUNTER — Encounter: Payer: Self-pay | Admitting: Internal Medicine

## 2013-10-29 ENCOUNTER — Encounter: Payer: Self-pay | Admitting: Internal Medicine

## 2013-11-29 ENCOUNTER — Encounter: Payer: Self-pay | Admitting: Internal Medicine

## 2013-12-01 LAB — BASIC METABOLIC PANEL
Anion Gap: 9 (ref 7–16)
BUN: 20 mg/dL — ABNORMAL HIGH (ref 7–18)
Calcium, Total: 9 mg/dL (ref 8.5–10.1)
Chloride: 105 mmol/L (ref 98–107)
Co2: 25 mmol/L (ref 21–32)
Creatinine: 1.3 mg/dL (ref 0.60–1.30)
EGFR (African American): 44 — ABNORMAL LOW
EGFR (Non-African Amer.): 38 — ABNORMAL LOW
Glucose: 129 mg/dL — ABNORMAL HIGH (ref 65–99)
Osmolality: 282 (ref 275–301)
Potassium: 2.9 mmol/L — ABNORMAL LOW (ref 3.5–5.1)
Sodium: 139 mmol/L (ref 136–145)

## 2013-12-01 LAB — LIPID PANEL
Cholesterol: 129 mg/dL (ref 0–200)
HDL Cholesterol: 38 mg/dL — ABNORMAL LOW (ref 40–60)
Ldl Cholesterol, Calc: 53 mg/dL (ref 0–100)
Triglycerides: 188 mg/dL (ref 0–200)
VLDL Cholesterol, Calc: 38 mg/dL (ref 5–40)

## 2013-12-01 LAB — HEMOGLOBIN A1C: Hemoglobin A1C: 6.1 % (ref 4.2–6.3)

## 2013-12-01 LAB — TSH: Thyroid Stimulating Horm: 0.034 u[IU]/mL — ABNORMAL LOW

## 2013-12-10 LAB — BASIC METABOLIC PANEL
Anion Gap: 7 (ref 7–16)
BUN: 18 mg/dL (ref 7–18)
Calcium, Total: 8.9 mg/dL (ref 8.5–10.1)
Chloride: 107 mmol/L (ref 98–107)
Co2: 26 mmol/L (ref 21–32)
Creatinine: 1.1 mg/dL (ref 0.60–1.30)
EGFR (African American): 53 — ABNORMAL LOW
EGFR (Non-African Amer.): 46 — ABNORMAL LOW
Glucose: 100 mg/dL — ABNORMAL HIGH (ref 65–99)
Osmolality: 281 (ref 275–301)
Potassium: 3.6 mmol/L (ref 3.5–5.1)
Sodium: 140 mmol/L (ref 136–145)

## 2013-12-27 ENCOUNTER — Encounter: Payer: Self-pay | Admitting: Internal Medicine

## 2014-01-27 ENCOUNTER — Encounter: Payer: Self-pay | Admitting: Internal Medicine

## 2014-02-25 LAB — TSH: Thyroid Stimulating Horm: 0.052 u[IU]/mL — ABNORMAL LOW

## 2014-02-26 ENCOUNTER — Encounter: Payer: Self-pay | Admitting: Internal Medicine

## 2014-03-29 ENCOUNTER — Encounter: Payer: Self-pay | Admitting: Internal Medicine

## 2014-03-30 LAB — TSH: Thyroid Stimulating Horm: 0.505 u[IU]/mL

## 2014-04-08 LAB — CBC WITH DIFFERENTIAL/PLATELET
Basophil #: 0.1 10*3/uL (ref 0.0–0.1)
Basophil %: 1.9 %
Eosinophil #: 0.4 10*3/uL (ref 0.0–0.7)
Eosinophil %: 5.2 %
HCT: 34.2 % — ABNORMAL LOW (ref 35.0–47.0)
HGB: 11.3 g/dL — ABNORMAL LOW (ref 12.0–16.0)
Lymphocyte #: 2.5 10*3/uL (ref 1.0–3.6)
Lymphocyte %: 37 %
MCH: 27.9 pg (ref 26.0–34.0)
MCHC: 33 g/dL (ref 32.0–36.0)
MCV: 85 fL (ref 80–100)
Monocyte #: 0.7 x10 3/mm (ref 0.2–0.9)
Monocyte %: 10 %
Neutrophil #: 3.1 10*3/uL (ref 1.4–6.5)
Neutrophil %: 45.9 %
Platelet: 344 10*3/uL (ref 150–440)
RBC: 4.05 10*6/uL (ref 3.80–5.20)
RDW: 15.8 % — ABNORMAL HIGH (ref 11.5–14.5)
WBC: 6.8 10*3/uL (ref 3.6–11.0)

## 2014-04-08 LAB — COMPREHENSIVE METABOLIC PANEL
Albumin: 3.7 g/dL (ref 3.4–5.0)
Alkaline Phosphatase: 76 U/L
Anion Gap: 8 (ref 7–16)
BUN: 11 mg/dL (ref 7–18)
Bilirubin,Total: 0.4 mg/dL (ref 0.2–1.0)
Calcium, Total: 9.1 mg/dL (ref 8.5–10.1)
Chloride: 100 mmol/L (ref 98–107)
Co2: 27 mmol/L (ref 21–32)
Creatinine: 0.98 mg/dL (ref 0.60–1.30)
EGFR (African American): >60
EGFR (Non-African Amer.): 53 — ABNORMAL LOW
Glucose: 86 mg/dL (ref 65–99)
Osmolality: 269 (ref 275–301)
Potassium: 3.3 mmol/L — ABNORMAL LOW (ref 3.5–5.1)
SGOT(AST): 16 U/L (ref 15–37)
SGPT (ALT): 13 U/L (ref 12–78)
Sodium: 135 mmol/L — ABNORMAL LOW (ref 136–145)
Total Protein: 7.5 g/dL (ref 6.4–8.2)

## 2014-04-08 LAB — TSH: Thyroid Stimulating Horm: 0.81 u[IU]/mL

## 2014-04-08 LAB — MAGNESIUM: Magnesium: 2.1 mg/dL

## 2014-04-28 ENCOUNTER — Encounter: Payer: Self-pay | Admitting: Internal Medicine

## 2014-05-13 ENCOUNTER — Encounter (INDEPENDENT_AMBULATORY_CARE_PROVIDER_SITE_OTHER): Payer: Self-pay

## 2014-05-13 ENCOUNTER — Encounter: Payer: Self-pay | Admitting: Cardiovascular Disease

## 2014-05-13 ENCOUNTER — Ambulatory Visit (INDEPENDENT_AMBULATORY_CARE_PROVIDER_SITE_OTHER): Payer: Medicare Other | Admitting: Cardiovascular Disease

## 2014-05-13 VITALS — BP 100/60 | HR 68 | Ht 62.0 in | Wt 117.0 lb

## 2014-05-13 DIAGNOSIS — R42 Dizziness and giddiness: Secondary | ICD-10-CM

## 2014-05-13 DIAGNOSIS — E785 Hyperlipidemia, unspecified: Secondary | ICD-10-CM

## 2014-05-13 DIAGNOSIS — R011 Cardiac murmur, unspecified: Secondary | ICD-10-CM

## 2014-05-13 DIAGNOSIS — I10 Essential (primary) hypertension: Secondary | ICD-10-CM

## 2014-05-13 DIAGNOSIS — I209 Angina pectoris, unspecified: Secondary | ICD-10-CM

## 2014-05-13 DIAGNOSIS — I25118 Atherosclerotic heart disease of native coronary artery with other forms of angina pectoris: Secondary | ICD-10-CM

## 2014-05-13 DIAGNOSIS — I2510 Atherosclerotic heart disease of native coronary artery without angina pectoris: Secondary | ICD-10-CM

## 2014-05-13 NOTE — Assessment & Plan Note (Signed)
Currently with no symptoms of angina. No further workup at this time. Continue current medication regimen. 

## 2014-05-13 NOTE — Assessment & Plan Note (Signed)
Encouraged her to stay on her pravastatin 

## 2014-05-13 NOTE — Assessment & Plan Note (Signed)
Dizziness has significantly improved from last year. Blood pressures are relatively stable. Occasional very low pressure though these are rare. Suggested she continue to monitor her blood pressure. No medication changes made

## 2014-05-13 NOTE — Patient Instructions (Signed)
You are doing well. No medication changes were made.  Please call us if you have new issues that need to be addressed before your next appt.  Your physician wants you to follow-up in: 12 months.  You will receive a reminder letter in the mail two months in advance. If you don't receive a letter, please call our office to schedule the follow-up appointment. 

## 2014-05-13 NOTE — Assessment & Plan Note (Addendum)
Blood pressure low today. Appears to be better and well controlled on numbers from the nursing facility. No changes made to the medications.

## 2014-05-13 NOTE — Progress Notes (Signed)
Patient ID: Carol Beck, female    DOB: Jul 12, 1927, 80 y.o.   MRN: [id number]  HPI Comments:  80 year-old Caucasian female with the pmh of nonobstructive coronary artery disease by left heart catheterization in 2000, hypertension, hyperlipidemia, hypothyroidism, restless leg syndrome, previous episodes of chest pain in 2011 when last seen in the office in 2011 , presents for routine followup. Previous symptoms of dizziness.  In followup today, she reports that she is doing well. She states that her balance is fine. Reports that the dizziness is much better from last year. Weight is down at least 7 pounds from her prior clinic visit. Nurse who presents with her today he reports that she is not eating much and they are having to supplement her diet with boost. Blood pressure numbers provided today shows systolic pressure typically in the 115 range  EKG shows normal sinus rhythm with rate 69 beats per minute, nonspecific ST abnormality   Outpatient Encounter Prescriptions as of 05/13/2014  Medication Sig  . Cholecalciferol (VITAMIN D) 2000 UNITS tablet Take 2,000 Units by mouth daily.  . mirtazapine (REMERON) 15 MG tablet Take 15 mg by mouth at bedtime.  . senna-docusate (SENOKOT-S) 8.6-50 MG per tablet Take 1 tablet by mouth daily.  Marland Kitchen. aspirin 81 MG tablet Take 81 mg by mouth daily.    . diazepam (VALIUM) 5 MG tablet Take 5 mg by mouth as needed for anxiety.  . donepezil (ARICEPT) 10 MG tablet Take 10 mg by mouth at bedtime.  . fluticasone (CUTIVATE) 0.05 % cream Apply 1 application topically as needed.  . fluticasone (FLONASE) 50 MCG/ACT nasal spray Place 2 sprays into the nose daily.  . hydrochlorothiazide (HYDRODIURIL) 25 MG tablet Take 25 mg by mouth daily.  Marland Kitchen. levothyroxine (SYNTHROID, LEVOTHROID) 50 MCG tablet Take 50 mcg by mouth daily before breakfast.  . loratadine (CLARITIN) 10 MG tablet Take 10 mg by mouth daily.  . meclizine (ANTIVERT) 25 MG tablet Take 25 mg by mouth 2 (two) times  daily.  . potassium chloride (K-DUR) 10 MEQ tablet Take 20 mEq by mouth daily.   . pravastatin (PRAVACHOL) 20 MG tablet Take 20 mg by mouth daily.  Marland Kitchen. venlafaxine XR (EFFEXOR-XR) 75 MG 24 hr capsule Take 75 mg by mouth daily with breakfast.     Review of Systems  Constitutional: Negative.   HENT: Negative.   Eyes: Negative.   Respiratory: Negative.   Cardiovascular: Negative.   Gastrointestinal: Negative.   Endocrine: Negative.   Musculoskeletal: Negative.   Skin: Negative.   Allergic/Immunologic: Negative.   Neurological: Negative.   Hematological: Negative.   Psychiatric/Behavioral: Negative.   All other systems reviewed and are negative.   BP 100/60  Pulse 68  Ht 5\' 2"  (1.575 m)  Wt 117 lb (53.071 kg)  BMI 21.39 kg/m2  Physical Exam  Nursing note and vitals reviewed. Constitutional: She is oriented to person, place, and time. She appears well-developed and well-nourished.  HENT:  Head: Normocephalic.  Nose: Nose normal.  Mouth/Throat: Oropharynx is clear and moist.  Eyes: Conjunctivae are normal. Pupils are equal, round, and reactive to light.  Neck: Normal range of motion. Neck supple. No JVD present.  Cardiovascular: Normal rate, regular rhythm, S1 normal, S2 normal, normal heart sounds and intact distal pulses.  Exam reveals no gallop and no friction rub.   No murmur heard. Pulmonary/Chest: Effort normal and breath sounds normal. No respiratory distress. She has no wheezes. She has no rales. She exhibits no tenderness.  Abdominal:  Soft. Bowel sounds are normal. She exhibits no distension. There is no tenderness.  Musculoskeletal: Normal range of motion. She exhibits no edema and no tenderness.  Lymphadenopathy:    She has no cervical adenopathy.  Neurological: She is alert and oriented to person, place, and time. Coordination normal.  Skin: Skin is warm and dry. No rash noted. No erythema.  Psychiatric: She has a normal mood and affect. Her behavior is normal.  Judgment and thought content normal.    Assessment and Plan

## 2014-05-29 ENCOUNTER — Encounter: Payer: Self-pay | Admitting: Internal Medicine

## 2014-06-29 ENCOUNTER — Encounter: Payer: Self-pay | Admitting: Internal Medicine

## 2014-07-29 ENCOUNTER — Encounter: Payer: Self-pay | Admitting: Internal Medicine

## 2014-08-29 ENCOUNTER — Encounter: Payer: Self-pay | Admitting: Internal Medicine

## 2014-09-28 ENCOUNTER — Encounter: Payer: Self-pay | Admitting: Internal Medicine

## 2014-10-29 ENCOUNTER — Encounter: Payer: Self-pay | Admitting: Internal Medicine

## 2014-11-29 ENCOUNTER — Encounter: Payer: Self-pay | Admitting: Internal Medicine

## 2014-12-28 ENCOUNTER — Encounter
Admit: 2014-12-28 | Disposition: A | Discharge status: Home / Self Care | Payer: Self-pay | Attending: Internal Medicine | Admitting: Internal Medicine

## 2015-01-28 ENCOUNTER — Encounter
Admit: 2015-01-28 | Disposition: A | Discharge status: Home / Self Care | Payer: Self-pay | Attending: Internal Medicine | Admitting: Internal Medicine

## 2015-02-15 LAB — TSH: Thyroid Stimulating Horm: 30.582 u[IU]/mL — ABNORMAL HIGH

## 2015-02-15 LAB — BASIC METABOLIC PANEL
Anion Gap: 9 (ref 7–16)
BUN: 25 mg/dL — ABNORMAL HIGH
Calcium, Total: 8.6 mg/dL — ABNORMAL LOW
Chloride: 103 mmol/L
Co2: 26 mmol/L
Creatinine: 1.24 mg/dL — ABNORMAL HIGH
EGFR (African American): 46 — ABNORMAL LOW
EGFR (Non-African Amer.): 40 — ABNORMAL LOW
Glucose: 132 mg/dL — ABNORMAL HIGH
Potassium: 3.4 mmol/L — ABNORMAL LOW
Sodium: 138 mmol/L

## 2015-04-20 ENCOUNTER — Telehealth: Payer: Self-pay | Admitting: *Deleted

## 2015-04-20 NOTE — Telephone Encounter (Signed)
Lmom to call our office. Time to schedule a carotid u/s (2 yr f/u). 

## 2015-05-12 ENCOUNTER — Encounter
Admission: RE | Admit: 2015-05-12 | Discharge: 2015-05-12 | Disposition: A | Discharge status: Home / Self Care | Payer: Medicare Other | Source: Ambulatory Visit | Attending: Internal Medicine | Admitting: Internal Medicine

## 2015-05-12 ENCOUNTER — Other Ambulatory Visit: Payer: Self-pay | Admitting: Cardiology

## 2015-05-12 DIAGNOSIS — Z79899 Other long term (current) drug therapy: Secondary | ICD-10-CM | POA: Insufficient documentation

## 2015-05-12 DIAGNOSIS — F329 Major depressive disorder, single episode, unspecified: Secondary | ICD-10-CM | POA: Insufficient documentation

## 2015-05-12 DIAGNOSIS — I6523 Occlusion and stenosis of bilateral carotid arteries: Secondary | ICD-10-CM

## 2015-05-12 LAB — HEMOGLOBIN A1C: Hgb A1c MFr Bld: 6.1 % — ABNORMAL HIGH (ref 4.0–6.0)

## 2015-05-12 LAB — LIPID PANEL
Cholesterol: 174 mg/dL (ref 0–200)
HDL: 52 mg/dL (ref >40)
LDL Cholesterol: 88 mg/dL (ref 0–99)
Total CHOL/HDL Ratio: 3.3 RATIO
Triglycerides: 168 mg/dL — ABNORMAL HIGH (ref <150)
VLDL: 34 mg/dL (ref 0–40)

## 2015-05-12 LAB — TSH: TSH: 28.056 u[IU]/mL — ABNORMAL HIGH (ref 0.350–4.500)

## 2015-05-24 ENCOUNTER — Ambulatory Visit (INDEPENDENT_AMBULATORY_CARE_PROVIDER_SITE_OTHER): Payer: Medicare Other

## 2015-05-24 ENCOUNTER — Ambulatory Visit (INDEPENDENT_AMBULATORY_CARE_PROVIDER_SITE_OTHER): Payer: Medicare Other | Admitting: Cardiovascular Disease

## 2015-05-24 ENCOUNTER — Encounter: Payer: Self-pay | Admitting: Cardiovascular Disease

## 2015-05-24 VITALS — BP 118/72 | HR 80 | Ht 62.5 in | Wt 136.0 lb

## 2015-05-24 DIAGNOSIS — I25118 Atherosclerotic heart disease of native coronary artery with other forms of angina pectoris: Secondary | ICD-10-CM | POA: Diagnosis not present

## 2015-05-24 DIAGNOSIS — I6523 Occlusion and stenosis of bilateral carotid arteries: Secondary | ICD-10-CM

## 2015-05-24 DIAGNOSIS — R011 Cardiac murmur, unspecified: Secondary | ICD-10-CM

## 2015-05-24 DIAGNOSIS — R42 Dizziness and giddiness: Secondary | ICD-10-CM

## 2015-05-24 DIAGNOSIS — I10 Essential (primary) hypertension: Secondary | ICD-10-CM

## 2015-05-24 NOTE — Assessment & Plan Note (Signed)
Blood pressure is well controlled on today's visit. No changes made to the medications. 

## 2015-05-24 NOTE — Assessment & Plan Note (Signed)
40% bilateral carotid disease. Repeat every 2 years

## 2015-05-24 NOTE — Assessment & Plan Note (Signed)
Currently with no sx. No change in her meds

## 2015-05-24 NOTE — Patient Instructions (Signed)
You are doing well. No medication changes were made.  Please call us if you have new issues that need to be addressed before your next appt.  Your physician wants you to follow-up in: 12 months.  You will receive a reminder letter in the mail two months in advance. If you don't receive a letter, please call our office to schedule the follow-up appointment. 

## 2015-05-24 NOTE — Progress Notes (Signed)
Patient ID: Carol Beck, female    DOB: [birth year], 81 y.o.   MRN: [id number]  HPI Comments:  81 year-old Caucasian female with the pmh of nonobstructive coronary artery disease by left heart catheterization in 2000, hypertension, hyperlipidemia, hypothyroidism, restless leg syndrome, previous episodes of chest pain in 2011 when last seen in the office in 2011 , presents for routine followup of her hypertension. Previous symptoms of dizziness.  In follow-up today, she denies any orthostasis, dizziness She is active, does not use a cane or walker She currently lives at the [facility] [place] Details provided with her today shows blood pressure 120-140 systolic, weight is stable, heart rate 60-80 Good appetite, no new complaints  EKG on today's visit shows normal sinus rhythm with rate 80 bpm, nonspecific ST abnormality  No Known Allergies  Current Outpatient Prescriptions on File Prior to Visit  Medication Sig Dispense Refill  . aspirin 81 MG tablet Take 81 mg by mouth daily.      . Cholecalciferol (VITAMIN D) 2000 UNITS tablet Take 2,000 Units by mouth daily.    . diazepam (VALIUM) 5 MG tablet Take 5 mg by mouth as needed for anxiety.    . donepezil (ARICEPT) 10 MG tablet Take 10 mg by mouth at bedtime.    . fluticasone (CUTIVATE) 0.05 % cream Apply 1 application topically 2 (two) times daily.     . fluticasone (FLONASE) 50 MCG/ACT nasal spray Place 2 sprays into the nose daily.    . hydrochlorothiazide (HYDRODIURIL) 25 MG tablet Take 25 mg by mouth daily.    Marland Kitchen loratadine (CLARITIN) 10 MG tablet Take 10 mg by mouth daily.    . meclizine (ANTIVERT) 25 MG tablet Take 25 mg by mouth 2 (two) times daily as needed.     . senna-docusate (SENOKOT-S) 8.6-50 MG per tablet Take 1 tablet by mouth 2 (two) times daily.     Marland Kitchen venlafaxine XR (EFFEXOR-XR) 75 MG 24 hr capsule Take 75 mg by mouth daily with breakfast.      No current facility-administered medications on file prior to visit.     Past Medical History  Diagnosis Date  . Hypertension     unspecified  . Hyperlipidemia, mixed   . CAD (coronary artery disease)     Native Vessel  . Hypothyroidism   . Restless leg syndrome   . Mild dementia   . Vertigo     Past Surgical History  Procedure Laterality Date  . Foot surgery    . Eye surgery      Social History  reports that she quit smoking about 34 years ago. Her smoking use included Cigarettes. She has a 20 pack-year smoking history. She does not have any smokeless tobacco history on file. She reports that she does not drink alcohol or use illicit drugs.  Family History family history includes Heart attack (age of onset: 63) in her mother; Heart disease in an other family member; Hypertension in her mother.   Review of Systems  Constitutional: Negative.   Respiratory: Negative.   Cardiovascular: Negative.   Gastrointestinal: Negative.   Musculoskeletal: Negative.   Neurological: Negative.   Hematological: Negative.   Psychiatric/Behavioral: Negative.   All other systems reviewed and are negative.   BP 118/72 mmHg  Pulse 80  Ht 5' 2.5" (1.588 m)  Wt 136 lb (61.689 kg)  BMI 24.46 kg/m2  Physical Exam  Constitutional: She is oriented to person, place, and time. She appears well-developed and well-nourished.  HENT:  Head: Normocephalic.  Nose: Nose normal.  Mouth/Throat: Oropharynx is clear and moist.  Eyes: Conjunctivae are normal. Pupils are equal, round, and reactive to light.  Neck: Normal range of motion. Neck supple. No JVD present.  Cardiovascular: Normal rate, regular rhythm, S1 normal, S2 normal, normal heart sounds and intact distal pulses.  Exam reveals no gallop and no friction rub.   No murmur heard. Pulmonary/Chest: Effort normal and breath sounds normal. No respiratory distress. She has no wheezes. She has no rales. She exhibits no tenderness.  Abdominal: Soft. Bowel sounds are normal. She exhibits no distension. There is no  tenderness.  Musculoskeletal: Normal range of motion. She exhibits no edema or tenderness.  Lymphadenopathy:    She has no cervical adenopathy.  Neurological: She is alert and oriented to person, place, and time. Coordination normal.  Skin: Skin is warm and dry. No rash noted. No erythema.  Psychiatric: She has a normal mood and affect. Her behavior is normal. Judgment and thought content normal.    Assessment and Plan  Nursing note and vitals reviewed.

## 2015-05-24 NOTE — Assessment & Plan Note (Signed)
Currently with no symptoms of angina. No further workup at this time. Continue current medication regimen. 

## 2015-05-25 ENCOUNTER — Telehealth: Payer: Self-pay | Admitting: Cardiovascular Disease

## 2015-05-25 NOTE — Telephone Encounter (Signed)
Patient husband wants copy of Carotid results from 05/24/15 .  Please call patient with results and let him know when he can pick up a copy.

## 2015-05-30 ENCOUNTER — Encounter
Admission: RE | Admit: 2015-05-30 | Discharge: 2015-05-30 | Disposition: A | Discharge status: Home / Self Care | Payer: Medicare Other | Source: Ambulatory Visit | Attending: Internal Medicine | Admitting: Internal Medicine

## 2015-05-30 DIAGNOSIS — F329 Major depressive disorder, single episode, unspecified: Secondary | ICD-10-CM | POA: Insufficient documentation

## 2015-05-31 NOTE — Telephone Encounter (Signed)
Results mailed to  pt.

## 2015-05-31 NOTE — Telephone Encounter (Signed)
Please call spouse with carotid u/s results.

## 2015-06-23 DIAGNOSIS — F329 Major depressive disorder, single episode, unspecified: Secondary | ICD-10-CM | POA: Diagnosis not present

## 2015-06-23 LAB — LIPID PANEL
Cholesterol: 143 mg/dL (ref 0–200)
HDL: 40 mg/dL — ABNORMAL LOW (ref >40)
LDL Cholesterol: 70 mg/dL (ref 0–99)
Total CHOL/HDL Ratio: 3.6 RATIO
Triglycerides: 164 mg/dL — ABNORMAL HIGH (ref <150)
VLDL: 33 mg/dL (ref 0–40)

## 2015-06-23 LAB — TSH: TSH: 1.169 u[IU]/mL (ref 0.350–4.500)

## 2015-06-23 LAB — HEMOGLOBIN A1C: Hgb A1c MFr Bld: 6.3 % — ABNORMAL HIGH (ref 4.0–6.0)

## 2015-06-30 ENCOUNTER — Encounter
Admission: RE | Admit: 2015-06-30 | Discharge: 2015-06-30 | Disposition: A | Discharge status: Home / Self Care | Payer: Medicare Other | Source: Ambulatory Visit | Attending: Internal Medicine | Admitting: Internal Medicine

## 2016-02-01 ENCOUNTER — Emergency Department
Admission: EM | Admit: 2016-02-01 | Discharge: 2016-02-01 | Disposition: A | Discharge status: Home / Self Care | Payer: Medicare Other | Attending: Emergency Medicine | Admitting: Emergency Medicine

## 2016-02-01 ENCOUNTER — Emergency Department: Payer: Medicare Other

## 2016-02-01 ENCOUNTER — Encounter: Payer: Self-pay | Admitting: Emergency Medicine

## 2016-02-01 DIAGNOSIS — S0990XA Unspecified injury of head, initial encounter: Secondary | ICD-10-CM

## 2016-02-01 DIAGNOSIS — Z7982 Long term (current) use of aspirin: Secondary | ICD-10-CM | POA: Insufficient documentation

## 2016-02-01 DIAGNOSIS — Y9289 Other specified places as the place of occurrence of the external cause: Secondary | ICD-10-CM | POA: Diagnosis not present

## 2016-02-01 DIAGNOSIS — Z79899 Other long term (current) drug therapy: Secondary | ICD-10-CM | POA: Insufficient documentation

## 2016-02-01 DIAGNOSIS — E782 Mixed hyperlipidemia: Secondary | ICD-10-CM | POA: Insufficient documentation

## 2016-02-01 DIAGNOSIS — E876 Hypokalemia: Secondary | ICD-10-CM | POA: Diagnosis not present

## 2016-02-01 DIAGNOSIS — S134XXA Sprain of ligaments of cervical spine, initial encounter: Secondary | ICD-10-CM | POA: Insufficient documentation

## 2016-02-01 DIAGNOSIS — Z87891 Personal history of nicotine dependence: Secondary | ICD-10-CM | POA: Insufficient documentation

## 2016-02-01 DIAGNOSIS — I2510 Atherosclerotic heart disease of native coronary artery without angina pectoris: Secondary | ICD-10-CM | POA: Insufficient documentation

## 2016-02-01 DIAGNOSIS — Y999 Unspecified external cause status: Secondary | ICD-10-CM | POA: Insufficient documentation

## 2016-02-01 DIAGNOSIS — E039 Hypothyroidism, unspecified: Secondary | ICD-10-CM | POA: Insufficient documentation

## 2016-02-01 DIAGNOSIS — Z881 Allergy status to other antibiotic agents status: Secondary | ICD-10-CM | POA: Insufficient documentation

## 2016-02-01 DIAGNOSIS — N289 Disorder of kidney and ureter, unspecified: Secondary | ICD-10-CM

## 2016-02-01 DIAGNOSIS — Y939 Activity, unspecified: Secondary | ICD-10-CM | POA: Insufficient documentation

## 2016-02-01 DIAGNOSIS — W19XXXA Unspecified fall, initial encounter: Secondary | ICD-10-CM

## 2016-02-01 DIAGNOSIS — I959 Hypotension, unspecified: Secondary | ICD-10-CM | POA: Diagnosis not present

## 2016-02-01 DIAGNOSIS — W010XXA Fall on same level from slipping, tripping and stumbling without subsequent striking against object, initial encounter: Secondary | ICD-10-CM | POA: Diagnosis not present

## 2016-02-01 DIAGNOSIS — Z888 Allergy status to other drugs, medicaments and biological substances status: Secondary | ICD-10-CM | POA: Insufficient documentation

## 2016-02-01 DIAGNOSIS — Z91013 Allergy to seafood: Secondary | ICD-10-CM | POA: Insufficient documentation

## 2016-02-01 DIAGNOSIS — Z8679 Personal history of other diseases of the circulatory system: Secondary | ICD-10-CM | POA: Insufficient documentation

## 2016-02-01 DIAGNOSIS — S139XXA Sprain of joints and ligaments of unspecified parts of neck, initial encounter: Secondary | ICD-10-CM

## 2016-02-01 LAB — COMPREHENSIVE METABOLIC PANEL
ALT: 10 U/L — ABNORMAL LOW (ref 14–54)
AST: 18 U/L (ref 15–41)
Albumin: 3.8 g/dL (ref 3.5–5.0)
Alkaline Phosphatase: 90 U/L (ref 38–126)
Anion gap: 8 (ref 5–15)
BUN: 19 mg/dL (ref 6–20)
CO2: 25 mmol/L (ref 22–32)
Calcium: 9 mg/dL (ref 8.9–10.3)
Chloride: 101 mmol/L (ref 101–111)
Creatinine, Ser: 1.75 mg/dL — ABNORMAL HIGH (ref 0.44–1.00)
GFR calc Af Amer: 29 mL/min — ABNORMAL LOW (ref >60)
GFR calc non Af Amer: 25 mL/min — ABNORMAL LOW (ref >60)
Glucose, Bld: 93 mg/dL (ref 65–99)
Potassium: 3.4 mmol/L — ABNORMAL LOW (ref 3.5–5.1)
Sodium: 134 mmol/L — ABNORMAL LOW (ref 135–145)
Total Bilirubin: 0.3 mg/dL (ref 0.3–1.2)
Total Protein: 7.2 g/dL (ref 6.5–8.1)

## 2016-02-01 LAB — CBC
HCT: 35.3 % (ref 35.0–47.0)
Hemoglobin: 11.8 g/dL — ABNORMAL LOW (ref 12.0–16.0)
MCH: 27.3 pg (ref 26.0–34.0)
MCHC: 33.5 g/dL (ref 32.0–36.0)
MCV: 81.5 fL (ref 80.0–100.0)
Platelets: 362 10*3/uL (ref 150–440)
RBC: 4.33 MIL/uL (ref 3.80–5.20)
RDW: 15 % — ABNORMAL HIGH (ref 11.5–14.5)
WBC: 9.3 10*3/uL (ref 3.6–11.0)

## 2016-02-01 LAB — TROPONIN I: Troponin I: <0.03 ng/mL (ref <0.031)

## 2016-02-01 MED ORDER — SODIUM CHLORIDE 0.9 % IV BOLUS (SEPSIS)
1000.0000 mL | Freq: Once | INTRAVENOUS | Status: AC
  Administered 2016-02-01: 1000 mL via INTRAVENOUS

## 2016-02-01 MED ORDER — SODIUM CHLORIDE 0.9 % IV BOLUS (SEPSIS)
500.0000 mL | Freq: Once | INTRAVENOUS | Status: AC
  Administered 2016-02-01: 500 mL via INTRAVENOUS

## 2016-02-01 MED ORDER — POTASSIUM CHLORIDE CRYS ER 20 MEQ PO TBCR
20.0000 meq | EXTENDED_RELEASE_TABLET | Freq: Once | ORAL | Status: AC
  Administered 2016-02-01: 20 meq via ORAL
  Filled 2016-02-01: qty 1

## 2016-02-01 NOTE — ED Notes (Signed)
Pt resting in bed, eyes closed, resp even and unlabored, in no acute distress

## 2016-02-01 NOTE — ED Notes (Signed)
Dr. Ladona Ridgelaylor notified of BP 83/39, 2nd IV established, fluids running

## 2016-02-01 NOTE — ED Provider Notes (Signed)
[facility] Emergency Department Provider Note  ____________________________________________  Time seen: Approximately 8:55 AM  I have reviewed the triage vital signs and the nursing notes.   HISTORY  Chief Complaint Fall; Head Injury; and Hypotension    HPI Carol Beck is a 82 y.o. female who states that she tripped over a rug in her room and fell injuring the back of her head and her left knee. Patient states that she is having pain in the back of her head as well as her neck. Patient has an abrasion over her anterior left knee and minimal pain. Patient denies any other extremity or trunk injuries. Patient states that she did not have any dizziness or near syncopal signs or symptoms prior to her tripping over the rug. On arrival to the ER the patient was hypotensive. Patient was started on IV fluid bolus. Patient denies any other significant symptoms or injuries at this time.   Past Medical History  Diagnosis Date  . Hypertension     unspecified  . Hyperlipidemia, mixed   . CAD (coronary artery disease)     Native Vessel  . Hypothyroidism   . Restless leg syndrome   . Mild dementia   . Vertigo     Patient Active Problem List   Diagnosis Date Noted  . Dizziness 05/13/2013  . Murmur, cardiac 05/13/2013  . CHEST PAIN UNSPECIFIED 05/09/2010  . Carotid stenosis 01/10/2010  . HYPERLIPIDEMIA-MIXED 08/17/2009  . Essential hypertension 08/17/2009  . CAD, NATIVE VESSEL 08/17/2009    Past Surgical History  Procedure Laterality Date  . Foot surgery    . Eye surgery      Current Outpatient Rx  Name  Route  Sig  Dispense  Refill  . aspirin 81 MG tablet   Oral   Take 81 mg by mouth daily.           . Cholecalciferol (VITAMIN D) 2000 UNITS tablet   Oral   Take 2,000 Units by mouth daily.         Marland Kitchen donepezil (ARICEPT) 10 MG tablet   Oral   Take 10 mg by mouth at bedtime.         . hydrochlorothiazide (HYDRODIURIL) 25 MG tablet    Oral   Take 25 mg by mouth daily.         . hydrOXYzine (ATARAX/VISTARIL) 25 MG tablet   Oral   Take 25 mg by mouth 3 (three) times daily.         Marland Kitchen ketoconazole (NIZORAL) 2 % shampoo   Topical   Apply 1 application topically 3 (three) times a week.          . levothyroxine (SYNTHROID, LEVOTHROID) 75 MCG tablet   Oral   Take 75 mcg by mouth daily before breakfast.         . mirtazapine (REMERON) 7.5 MG tablet   Oral   Take 7.5 mg by mouth at bedtime.         . potassium chloride SA (K-DUR,KLOR-CON) 20 MEQ tablet   Oral   Take 20 mEq by mouth daily.         . pravastatin (PRAVACHOL) 40 MG tablet   Oral   Take 40 mg by mouth daily.         . risperiDONE (RISPERDAL) 0.25 MG tablet   Oral   Take 0.25 mg by mouth daily.         Marland Kitchen senna-docusate (SENOKOT-S) 8.6-50 MG per tablet  Oral   Take 1 tablet by mouth 2 (two) times daily.          Marland Kitchen venlafaxine XR (EFFEXOR-XR) 75 MG 24 hr capsule   Oral   Take 75 mg by mouth daily with breakfast.            Allergies Evista; Shellfish allergy; and Sulfa antibiotics  Family History  Problem Relation Age of Onset  . Heart disease    . Hypertension Mother   . Heart attack Mother 42    Social History Social History  Substance Use Topics  . Smoking status: Former Smoker -- 1.00 packs/day for 20 years    Types: Cigarettes    Quit date: 12/10/1980  . Smokeless tobacco: None  . Alcohol Use: No    Review of Systems Constitutional: No fever/chills Eyes: No visual changes. ENT: No sore throat.Positive for headache in the back of her head and neck pain. Cardiovascular: Denies chest pain. Respiratory: Denies shortness of breath. Gastrointestinal: No abdominal pain.  No nausea, no vomiting.  No diarrhea.  No constipation. Genitourinary: Negative for dysuria. Musculoskeletal: Negative for back pain. Patient with minimal pain to the left knee and her cervical spine. Skin: Negative for rash. Patient with  abrasion to the inferior left knee. Neurological: Negative for headaches, focal weakness or numbness.  10-point ROS otherwise negative.  ____________________________________________   PHYSICAL EXAM:  VITAL SIGNS: ED Triage Vitals  Enc Vitals Group     BP 02/01/16 0844 83/39 mmHg     Pulse Rate 02/01/16 0844 62     Resp 02/01/16 0844 20     Temp 02/01/16 0844 97.9 F (36.6 C)     Temp Source 02/01/16 0844 Oral     SpO2 02/01/16 0844 98 %     Weight 02/01/16 0844 120 lb (54.432 kg)     Height 02/01/16 0844  (1.575 m)     Head Cir --      Peak Flow --      Pain Score 02/01/16 0844 3     Pain Loc --      Pain Edu? --      Excl. in GC? --     Constitutional: Alert and oriented. Well appearing and in no acute distress. Eyes: Conjunctivae are normal. PERRL. EOMI. Head: Patient with mild tenderness to her mid occipital scalp but no evidence of any significant hematomas or lesions. Nose: No congestion/rhinnorhea. Mouth/Throat: Mucous membranes are moist.  Oropharynx non-erythematous. Neck: No stridor. She with mild tenderness to her cervical spine from like C1-C5.  Cardiovascular: Normal rate, regular rhythm. Grossly normal heart sounds.  Good peripheral circulation. Respiratory: Normal respiratory effort.  No retractions. Lungs CTAB. Gastrointestinal: Soft and nontender. No distention. No abdominal bruits. No CVA tenderness. Musculoskeletal: Mild lower extremity tenderness to the inferior left knee with no significant swelling or deformity, and she has full range of motion and is distally neurovascularly intact. No edema.  No joint effusions. Neurologic:  Normal speech and language. No gross focal neurologic deficits are appreciated. No gait instability. Skin:  Skin is warm, dry and intact. No rash noted. Patient with small abrasion left anterior knee. Psychiatric: Mood and affect are normal. Speech and behavior are normal.  ____________________________________________    LABS (all labs ordered are listed, but only abnormal results are displayed)  Labs Reviewed  CBC - Abnormal; Notable for the following:    Hemoglobin 11.8 (*)    RDW 15.0 (*)    All other components within normal limits  COMPREHENSIVE METABOLIC PANEL - Abnormal; Notable for the following:    Sodium 134 (*)    Potassium 3.4 (*)    Creatinine, Ser 1.75 (*)    ALT 10 (*)    GFR calc non Af Amer 25 (*)    GFR calc Af Amer 29 (*)    All other components within normal limits  TROPONIN I  URINALYSIS COMPLETEWITH MICROSCOPIC ([facility] ONLY)   ____________________________________________  EKG  ED ECG REPORT I, Leona Carryaylor,  Zinia Innocent M, the attending physician, personally viewed and interpreted this ECG.   Date: 02/01/2016  EKG Time: 08 51  Rate: 61  Rhythm: normal sinus rhythm  Axis: Normal  Intervals:Prolonged QT  ST&T Change: Nonspecific ST and T-wave changes  ____________________________________________  RADIOLOGY  Ct Head Wo Contrast  02/01/2016  CLINICAL DATA:  Fall with head and knee injury.  Initial encounter. EXAM: CT HEAD WITHOUT CONTRAST CT CERVICAL SPINE WITHOUT CONTRAST TECHNIQUE: Multidetector CT imaging of the head and cervical spine was performed following the standard protocol without intravenous contrast. Multiplanar CT image reconstructions of the cervical spine were also generated. COMPARISON:  None. FINDINGS: CT HEAD FINDINGS Skull and Sinuses:Negative for fracture or hemo sinus. Partially visualized right maxillary sinus opacification, presumed sinusitis but minimally imaged. Visualized orbits: Negative. Brain: No evidence of acute infarction, hemorrhage, hydrocephalus, or mass lesion/mass effect. Age related cerebral volume loss and periventricular microvascular ischemic change. CT CERVICAL SPINE FINDINGS Negative for acute fracture or subluxation. No prevertebral edema. No gross cervical canal hematoma. Multilevel advanced facet arthropathy with mild C4-5 anterolisthesis.  There has been ankylosis of the C3-4 facets. Diffuse degenerative disc change. No evidence of high-grade canal or foraminal stenosis. IMPRESSION: No evidence of intracranial or cervical spine injury. Electronically Signed   By: Marnee SpringJonathon  Watts M.D.   On: 02/01/2016 10:09   Ct Cervical Spine Wo Contrast  02/01/2016  CLINICAL DATA:  Fall with head and knee injury.  Initial encounter. EXAM: CT HEAD WITHOUT CONTRAST CT CERVICAL SPINE WITHOUT CONTRAST TECHNIQUE: Multidetector CT imaging of the head and cervical spine was performed following the standard protocol without intravenous contrast. Multiplanar CT image reconstructions of the cervical spine were also generated. COMPARISON:  None. FINDINGS: CT HEAD FINDINGS Skull and Sinuses:Negative for fracture or hemo sinus. Partially visualized right maxillary sinus opacification, presumed sinusitis but minimally imaged. Visualized orbits: Negative. Brain: No evidence of acute infarction, hemorrhage, hydrocephalus, or mass lesion/mass effect. Age related cerebral volume loss and periventricular microvascular ischemic change. CT CERVICAL SPINE FINDINGS Negative for acute fracture or subluxation. No prevertebral edema. No gross cervical canal hematoma. Multilevel advanced facet arthropathy with mild C4-5 anterolisthesis. There has been ankylosis of the C3-4 facets. Diffuse degenerative disc change. No evidence of high-grade canal or foraminal stenosis. IMPRESSION: No evidence of intracranial or cervical spine injury. Electronically Signed   By: Marnee SpringJonathon  Watts M.D.   On: 02/01/2016 10:09    ____________________________________________   PROCEDURES  Procedure(s) performed: None  Critical Care performed: No  ____________________________________________   INITIAL IMPRESSION / ASSESSMENT AND PLAN / ED COURSE  Pertinent labs & imaging results that were available during my care of the patient were reviewed by me and considered in my medical decision making (see  chart for details).  12:15 PM Patient get CT head and cervical spine as well as an x-ray of her left knee. Patient was given some IV fluids for her mild hypotension. We'll get routine labs just to make sure they're okay secondary to patient presents with hypotension.  12:15 PM  Patient's blood pressures much improved after her liter and a half of normal saline. Patient's creatinine was elevated so I felt that she was mildly dehydrated. Patient's potassium was also 3.4. Patient will be given 20 mEq of potassium as well prior to discharge. She was told to follow-up with her primary care M.D. to check her creatinine I can in about a week and to make sure to increase fluids. She should also discuss with them the use of her blood pressure medicine since she was so hypotensive. ____________________________________________   FINAL CLINICAL IMPRESSION(S) / ED DIAGNOSES  Final diagnoses:  Fall, initial encounter  Closed head injury, initial encounter  Cervical sprain, initial encounter  Acute renal insufficiency  Hypotension, unspecified hypotension type  Hypokalemia     Leona Carry, MD 02/01/16 1215

## 2016-02-01 NOTE — ED Notes (Signed)
[facility] tripping over a rug this am and falling.  Pt with hematoma noted to back of head.  Pt is currently on plavix.  Pt with IV in place per ems in left hand 20g sl.  Pt hypotensive on scene according to ems 80/40.  Per ems CBG was 96.  Pt alert at this time.

## 2016-06-24 ENCOUNTER — Encounter: Payer: Self-pay | Admitting: Emergency Medicine

## 2016-06-24 ENCOUNTER — Emergency Department
Admission: EM | Admit: 2016-06-24 | Discharge: 2016-06-24 | Disposition: A | Discharge status: Home / Self Care | Payer: Medicare Other | Attending: Emergency Medicine | Admitting: Emergency Medicine

## 2016-06-24 DIAGNOSIS — E039 Hypothyroidism, unspecified: Secondary | ICD-10-CM | POA: Diagnosis not present

## 2016-06-24 DIAGNOSIS — I10 Essential (primary) hypertension: Secondary | ICD-10-CM | POA: Diagnosis not present

## 2016-06-24 DIAGNOSIS — Z87891 Personal history of nicotine dependence: Secondary | ICD-10-CM | POA: Insufficient documentation

## 2016-06-24 DIAGNOSIS — I2510 Atherosclerotic heart disease of native coronary artery without angina pectoris: Secondary | ICD-10-CM | POA: Diagnosis not present

## 2016-06-24 DIAGNOSIS — Z7982 Long term (current) use of aspirin: Secondary | ICD-10-CM | POA: Insufficient documentation

## 2016-06-24 DIAGNOSIS — K625 Hemorrhage of anus and rectum: Secondary | ICD-10-CM | POA: Diagnosis not present

## 2016-06-24 DIAGNOSIS — Z79899 Other long term (current) drug therapy: Secondary | ICD-10-CM | POA: Insufficient documentation

## 2016-06-24 LAB — CBC
HCT: 34.7 % — ABNORMAL LOW (ref 35.0–47.0)
Hemoglobin: 11.7 g/dL — ABNORMAL LOW (ref 12.0–16.0)
MCH: 28.4 pg (ref 26.0–34.0)
MCHC: 33.8 g/dL (ref 32.0–36.0)
MCV: 84 fL (ref 80.0–100.0)
Platelets: 300 10*3/uL (ref 150–440)
RBC: 4.13 MIL/uL (ref 3.80–5.20)
RDW: 15.7 % — ABNORMAL HIGH (ref 11.5–14.5)
WBC: 8.3 10*3/uL (ref 3.6–11.0)

## 2016-06-24 LAB — COMPREHENSIVE METABOLIC PANEL
ALT: 10 U/L — ABNORMAL LOW (ref 14–54)
AST: 20 U/L (ref 15–41)
Albumin: 3.9 g/dL (ref 3.5–5.0)
Alkaline Phosphatase: 78 U/L (ref 38–126)
Anion gap: 9 (ref 5–15)
BUN: 15 mg/dL (ref 6–20)
CO2: 24 mmol/L (ref 22–32)
Calcium: 9.2 mg/dL (ref 8.9–10.3)
Chloride: 107 mmol/L (ref 101–111)
Creatinine, Ser: 1.11 mg/dL — ABNORMAL HIGH (ref 0.44–1.00)
GFR calc Af Amer: 51 mL/min — ABNORMAL LOW (ref >60)
GFR calc non Af Amer: 44 mL/min — ABNORMAL LOW (ref >60)
Glucose, Bld: 105 mg/dL — ABNORMAL HIGH (ref 65–99)
Potassium: 4 mmol/L (ref 3.5–5.1)
Sodium: 140 mmol/L (ref 135–145)
Total Bilirubin: 0.5 mg/dL (ref 0.3–1.2)
Total Protein: 7.1 g/dL (ref 6.5–8.1)

## 2016-06-24 LAB — TYPE AND SCREEN
ABO/RH(D): A POS
Antibody Screen: NEGATIVE

## 2016-06-24 LAB — HEMOGLOBIN AND HEMATOCRIT, BLOOD
HCT: 33 % — ABNORMAL LOW (ref 35.0–47.0)
Hemoglobin: 11.2 g/dL — ABNORMAL LOW (ref 12.0–16.0)

## 2016-06-24 NOTE — ED Notes (Signed)
Pt has hx of dementia. Pt states she had diarrhea but not sure if it was yesterday or today. Pt also states she remembers she was sent here before for the same.

## 2016-06-24 NOTE — Discharge Instructions (Signed)
We believe your symptoms are caused by either a small bleeding hemorrhoid, or from a small amount of bleeding within the intestines.  If you develop any new or worsening symptoms, ongoing bleeding, including vomiting not controlled with medication, fever greater, sabdominal pain, or other symptoms that concern you, please return immediately to the Emergency Department.  Follow-up with gastroentrology.

## 2016-06-24 NOTE — ED Provider Notes (Signed)
[facility] Emergency Department Provider Note   ____________________________________________   First MD Initiated Contact with Patient 06/24/16 1307     (approximate)  I have reviewed the triage vital signs and the nursing notes.   HISTORY  Chief Complaint Rectal Bleeding    HPI Carol Beck is a 82 y.o. female presents for evaluation of blood in her stool.  The patient is here with her daughter who assists in history. The patient had a bowel movement today, and it was noted that she had a "couple of drops" of blood on the floor in the bathroom after stooling. Patient reports no pain, no abdominal discomfort, no further bloody stools. She does report a history of hemorrhoids that cause small amounts of bleeding in the past.  The patient does not take any blood thinners, aspirin, or ibuprofen.  She denies any history of bleeding trouble or stomach ulcers in the past. Thinks she may have had a colonoscopy about 20 years ago. No history of diverticulosis known.  No shortness of breath, lightheadedness, weakness or trouble breathing. Patient reports she feels hungry at this time, no complaints.   Past Medical History:  Diagnosis Date  . CAD (coronary artery disease)    Native Vessel  . Hyperlipidemia, mixed   . Hypertension    unspecified  . Hypothyroidism   . Mild dementia   . Restless leg syndrome   . Vertigo     Patient Active Problem List   Diagnosis Date Noted  . Dizziness 05/13/2013  . Murmur, cardiac 05/13/2013  . CHEST PAIN UNSPECIFIED 05/09/2010  . Carotid stenosis 01/10/2010  . HYPERLIPIDEMIA-MIXED 08/17/2009  . Essential hypertension 08/17/2009  . CAD, NATIVE VESSEL 08/17/2009    Past Surgical History:  Procedure Laterality Date  . EYE SURGERY    . FOOT SURGERY      Prior to Admission medications   Medication Sig Start Date End Date Taking? Authorizing Provider  aspirin 81 MG tablet Take 81 mg by mouth daily.       Historical Provider, MD  Cholecalciferol (VITAMIN D) 2000 UNITS tablet Take 2,000 Units by mouth daily.    Historical Provider, MD  donepezil (ARICEPT) 10 MG tablet Take 10 mg by mouth at bedtime.    Historical Provider, MD  hydrochlorothiazide (HYDRODIURIL) 25 MG tablet Take 25 mg by mouth daily.    Historical Provider, MD  hydrOXYzine (ATARAX/VISTARIL) 25 MG tablet Take 25 mg by mouth 3 (three) times daily.    Historical Provider, MD  ketoconazole (NIZORAL) 2 % shampoo Apply 1 application topically 3 (three) times a week.     Historical Provider, MD  levothyroxine (SYNTHROID, LEVOTHROID) 75 MCG tablet Take 75 mcg by mouth daily before breakfast.    Historical Provider, MD  mirtazapine (REMERON) 7.5 MG tablet Take 7.5 mg by mouth at bedtime.    Historical Provider, MD  potassium chloride SA (K-DUR,KLOR-CON) 20 MEQ tablet Take 20 mEq by mouth daily.    Historical Provider, MD  pravastatin (PRAVACHOL) 40 MG tablet Take 40 mg by mouth daily.    Historical Provider, MD  risperiDONE (RISPERDAL) 0.25 MG tablet Take 0.25 mg by mouth daily.    Historical Provider, MD  senna-docusate (SENOKOT-S) 8.6-50 MG per tablet Take 1 tablet by mouth 2 (two) times daily.     Historical Provider, MD  venlafaxine XR (EFFEXOR-XR) 75 MG 24 hr capsule Take 75 mg by mouth daily with breakfast.     Historical Provider, MD  Patient no longer taking  aspirin  Allergies Evista [raloxifene]; Shellfish allergy; and Sulfa antibiotics  Family History  Problem Relation Age of Onset  . Hypertension Mother   . Heart attack Mother 63  . Heart disease      Social History Social History  Substance Use Topics  . Smoking status: Former Smoker    Packs/day: 1.00    Years: 20.00    Types: Cigarettes    Quit date: 12/10/1980  . Smokeless tobacco: Not on file  . Alcohol use No    Review of Systems Constitutional: No fever/chills Eyes: No visual changes. ENT: No sore throat. Cardiovascular: Denies chest pain. Respiratory:  Denies shortness of breath. Gastrointestinal: No abdominal pain.  No nausea, no vomiting.  No diarrhea.  No constipation. Genitourinary: Negative for dysuria. Musculoskeletal: Negative for back pain. Skin: Negative for rash. Neurological: Negative for headaches, focal weakness or numbness.  10-point ROS otherwise negative.  ____________________________________________   PHYSICAL EXAM:  VITAL SIGNS: ED Triage Vitals  Enc Vitals Group     BP 06/24/16 1300 (!) 135/58     Pulse Rate 06/24/16 1300 74     Resp 06/24/16 1300 16     Temp 06/24/16 1300 98.6 F (37 C)     Temp Source 06/24/16 1300 Oral     SpO2 06/24/16 1300 98 %     Weight 06/24/16 1300 123 lb (55.8 kg)     Height 06/24/16 1300 5\' 2"  (1.575 m)     Head Circumference --      Peak Flow --      Pain Score 06/24/16 1301 0     Pain Loc --      Pain Edu? --      Excl. in GC? --     Constitutional: Alert and orientedTo place, situation, but not to time of day. Well appearing and in no acute distress. Patient and daughter both very pleasant. Eyes: Conjunctivae are normal. PERRL. EOMI. Head: Atraumatic. Nose: No congestion/rhinnorhea. Mouth/Throat: Mucous membranes are moist.  Oropharynx non-erythematous. Neck: No stridor.   Cardiovascular: Normal rate, regular rhythm. Grossly normal heart sounds.  Good peripheral circulation. Respiratory: Normal respiratory effort.  No retractions. Lungs CTAB. Gastrointestinal: Soft and nontender. No distention. No abdominal bruits. No CVA tenderness. Rectal exam performed with nurse, Selena Batten. Small external hemorrhoid, stool brown in color and heme negative. No evidence of bleeding noted. Musculoskeletal: No lower extremity tenderness nor edema.   Neurologic:  Normal speech and language. No gross focal neurologic deficits are appreciated. Skin:  Skin is warm, dry and intact. No rash noted. Psychiatric: Mood and affect are normal. Speech and behavior are  normal.  ____________________________________________   LABS (all labs ordered are listed, but only abnormal results are displayed)  Labs Reviewed  COMPREHENSIVE METABOLIC PANEL - Abnormal; Notable for the following:       Result Value   Glucose, Bld 105 (*)    Creatinine, Ser 1.11 (*)    ALT 10 (*)    GFR calc non Af Amer 44 (*)    GFR calc Af Amer 51 (*)    All other components within normal limits  CBC - Abnormal; Notable for the following:    Hemoglobin 11.7 (*)    HCT 34.7 (*)    RDW 15.7 (*)    All other components within normal limits  HEMOGLOBIN AND HEMATOCRIT, BLOOD - Abnormal; Notable for the following:    Hemoglobin 11.2 (*)    HCT 33.0 (*)    All other components within normal limits  POC OCCULT BLOOD, ED  TYPE AND SCREEN   ____________________________________________  EKG   ____________________________________________  RADIOLOGY   ____________________________________________   PROCEDURES  Procedure(s) performed: None  Procedures  Critical Care performed: No  ____________________________________________   INITIAL IMPRESSION / ASSESSMENT AND PLAN / ED COURSE  Pertinent labs & imaging results that were available during my care of the patient were reviewed by me and considered in my medical decision making (see chart for details).  Patient asymptomatic, hemodynamic was stable. Guaiac negative here, a based on history would suspect possibly a small internal hemorrhoid ordered, small external hemorrhoid that was noted may have had slight bleeding that is now resolved. No evidence of major hemorrhage noted at this time, but we will observe the patient and repeat hemoglobin 3 hours to evaluate for stability. This hemoglobin is stable, and discussed with the patient and daughter both are comfortable [facility], observation, and will return if ongoing signs of bleeding or new concerns such as fever, abdominal pain, or  other concerns arise.  Ongoing care assigned to Dr. Mayford KnifeWilliams the plan to repeat hemoglobin for observation, if stable discharged home with GI follow-up and traditional bleeding return precautions.  Clinical Course     ____________________________________________   FINAL CLINICAL IMPRESSION(S) / ED DIAGNOSES  Final diagnoses:  Rectal bleeding      NEW MEDICATIONS STARTED DURING THIS VISIT:  New Prescriptions   No medications on file     Note:  This document was prepared using Dragon voice recognition software and may include unintentional dictation errors.     Sharyn CreamerMark Markas Aldredge, MD 06/24/16 [phone]

## 2016-06-24 NOTE — ED Notes (Signed)
Pt and Pt's daughter verbalized understanding of discharge instructions. NAD at this time. 

## 2016-06-24 NOTE — ED Provider Notes (Signed)
Labs Reviewed  COMPREHENSIVE METABOLIC PANEL - Abnormal; Notable for the following:       Result Value   Glucose, Bld 105 (*)    Creatinine, Ser 1.11 (*)    ALT 10 (*)    GFR calc non Af Amer 44 (*)    GFR calc Af Amer 51 (*)    All other components within normal limits  CBC - Abnormal; Notable for the following:    Hemoglobin 11.7 (*)    HCT 34.7 (*)    RDW 15.7 (*)    All other components within normal limits  HEMOGLOBIN AND HEMATOCRIT, BLOOD - Abnormal; Notable for the following:    Hemoglobin 11.2 (*)    HCT 33.0 (*)    All other components within normal limits  POC OCCULT BLOOD, ED  TYPE AND SCREEN  Patient looks well, no acute distress, she is not actively bleeding at this time. Hemoglobin has decreased but not significantly so. Family and the patient went for preferred for her to go home. I will refer her for follow-up with gastroenterology.    Emily FilbertJonathan E Williams, MD 06/24/16 [phone]

## 2016-06-24 NOTE — ED Triage Notes (Signed)
[facility]  Per EMS staff called them due to one episode of dark stools.  Patient reports, "I was bleeding from my rectum, I think it was bright red."  Patient denies abdominal pain, denies recent falls.  Patient is alert and oriented to self, place and situation but not time.  Patient has history of dementia.

## 2016-08-29 ENCOUNTER — Ambulatory Visit: Payer: Medicare Other | Admitting: Cardiovascular Disease

## 2016-09-04 ENCOUNTER — Telehealth: Payer: Self-pay | Admitting: Cardiovascular Disease

## 2016-09-04 NOTE — Telephone Encounter (Signed)
[facility] but not someone to come with her to an appt.   Family lives out of town and would like to verify that a routine fu is needed.  Per Carol Beck patient is seen by Doctors making house calls monthly.   Please call Carol Beck to Discuss.

## 2016-09-04 NOTE — Telephone Encounter (Signed)
Spoke w/ Carol DikeJennifer.  Advised her that if pt is not seen yearly, we cannot provide refills on her meds.  She will speak w/ [facility] and see if they can provide refills on pt's cardiac meds. She will cancel appt and call back if we can be of further assistance.

## 2017-04-01 ENCOUNTER — Emergency Department
Admission: EM | Admit: 2017-04-01 | Discharge: 2017-04-01 | Disposition: A | Discharge status: Home / Self Care | Payer: Medicare Other | Attending: Emergency Medicine | Admitting: Emergency Medicine

## 2017-04-01 DIAGNOSIS — I2510 Atherosclerotic heart disease of native coronary artery without angina pectoris: Secondary | ICD-10-CM | POA: Diagnosis not present

## 2017-04-01 DIAGNOSIS — Z87891 Personal history of nicotine dependence: Secondary | ICD-10-CM | POA: Diagnosis not present

## 2017-04-01 DIAGNOSIS — E039 Hypothyroidism, unspecified: Secondary | ICD-10-CM | POA: Diagnosis not present

## 2017-04-01 DIAGNOSIS — Z7982 Long term (current) use of aspirin: Secondary | ICD-10-CM | POA: Diagnosis not present

## 2017-04-01 DIAGNOSIS — R112 Nausea with vomiting, unspecified: Secondary | ICD-10-CM | POA: Insufficient documentation

## 2017-04-01 DIAGNOSIS — Z79899 Other long term (current) drug therapy: Secondary | ICD-10-CM | POA: Diagnosis not present

## 2017-04-01 DIAGNOSIS — I10 Essential (primary) hypertension: Secondary | ICD-10-CM | POA: Insufficient documentation

## 2017-04-01 DIAGNOSIS — R1110 Vomiting, unspecified: Secondary | ICD-10-CM

## 2017-04-01 LAB — URINALYSIS, COMPLETE (UACMP) WITH MICROSCOPIC
Bilirubin Urine: NEGATIVE
Glucose, UA: NEGATIVE mg/dL
Hgb urine dipstick: NEGATIVE
Ketones, ur: 5 mg/dL — AB
Nitrite: NEGATIVE
Protein, ur: 30 mg/dL — AB
Specific Gravity, Urine: 1.024 (ref 1.005–1.030)
pH: 5 (ref 5.0–8.0)

## 2017-04-01 LAB — CBC
HCT: 35.3 % (ref 35.0–47.0)
Hemoglobin: 12.1 g/dL (ref 12.0–16.0)
MCH: 28.3 pg (ref 26.0–34.0)
MCHC: 34.1 g/dL (ref 32.0–36.0)
MCV: 82.9 fL (ref 80.0–100.0)
Platelets: 339 10*3/uL (ref 150–440)
RBC: 4.26 MIL/uL (ref 3.80–5.20)
RDW: 15 % — ABNORMAL HIGH (ref 11.5–14.5)
WBC: 8.1 10*3/uL (ref 3.6–11.0)

## 2017-04-01 LAB — COMPREHENSIVE METABOLIC PANEL
ALT: 14 U/L (ref 14–54)
AST: 24 U/L (ref 15–41)
Albumin: 4 g/dL (ref 3.5–5.0)
Alkaline Phosphatase: 83 U/L (ref 38–126)
Anion gap: 10 (ref 5–15)
BUN: 18 mg/dL (ref 6–20)
CO2: 24 mmol/L (ref 22–32)
Calcium: 9.3 mg/dL (ref 8.9–10.3)
Chloride: 103 mmol/L (ref 101–111)
Creatinine, Ser: 1.03 mg/dL — ABNORMAL HIGH (ref 0.44–1.00)
GFR calc Af Amer: 55 mL/min — ABNORMAL LOW (ref >60)
GFR calc non Af Amer: 47 mL/min — ABNORMAL LOW (ref >60)
Glucose, Bld: 116 mg/dL — ABNORMAL HIGH (ref 65–99)
Potassium: 3.9 mmol/L (ref 3.5–5.1)
Sodium: 137 mmol/L (ref 135–145)
Total Bilirubin: 0.8 mg/dL (ref 0.3–1.2)
Total Protein: 7.8 g/dL (ref 6.5–8.1)

## 2017-04-01 LAB — LIPASE, BLOOD: Lipase: 47 U/L (ref 11–51)

## 2017-04-01 MED ORDER — CEFTRIAXONE SODIUM IN DEXTROSE 20 MG/ML IV SOLN
1.0000 g | Freq: Once | INTRAVENOUS | Status: DC
  Filled 2017-04-01: qty 50

## 2017-04-01 MED ORDER — ONDANSETRON HCL 4 MG/2ML IJ SOLN
4.0000 mg | Freq: Once | INTRAMUSCULAR | Status: AC
  Administered 2017-04-01: 4 mg via INTRAVENOUS
  Filled 2017-04-01: qty 2

## 2017-04-01 MED ORDER — DEXTROSE 5 % IV SOLN
1.0000 g | Freq: Once | INTRAVENOUS | Status: AC
  Administered 2017-04-01: 1 g via INTRAVENOUS
  Filled 2017-04-01: qty 10

## 2017-04-01 MED ORDER — SODIUM CHLORIDE 0.9 % IV BOLUS (SEPSIS)
1000.0000 mL | Freq: Once | INTRAVENOUS | Status: AC
  Administered 2017-04-01: 1000 mL via INTRAVENOUS

## 2017-04-01 MED ORDER — DEXTROSE 5 % IV SOLN
1.0000 g | Freq: Once | INTRAVENOUS | Status: DC
  Filled 2017-04-01: qty 10

## 2017-04-01 MED ORDER — ONDANSETRON 4 MG PO TBDP: 4.0000 mg | ORAL_TABLET | Freq: Three times a day (TID) | ORAL | 0 refills | Status: DC | PRN

## 2017-04-01 MED ORDER — DEXTROSE 5 % IV SOLN
INTRAVENOUS | Status: AC
  Filled 2017-04-01: qty 10

## 2017-04-01 NOTE — ED Notes (Signed)
Ambulated to and from the BR with stand by assistance - she was unable to provide urine sample at this time  IVF infusing

## 2017-04-01 NOTE — ED Triage Notes (Signed)
[facility] with reports of nausea with no vomiting for the last three days  Recent diagnosis of UTI and was started on an abx   Pt verbalizes that she has not been able to eat

## 2017-04-01 NOTE — ED Provider Notes (Signed)
Patient has been tolerating liquids by mouth. I have given one dose of Rocephin IV for UTI. She is stable for outpatient follow-up.   Emily FilbertWilliams, Jinny Sweetland E, MD 04/01/17 [phone]

## 2017-04-01 NOTE — ED Notes (Signed)
[facility] by family.

## 2017-04-01 NOTE — ED Provider Notes (Signed)
[facility] Emergency Department Provider Note  Time seen: 1:42 PM  I have reviewed the triage vital signs and the nursing notes.   HISTORY  Chief Complaint Emesis    HPI Carol Beck is a 83 y.o. female with a past medical history of hypertension, hyperlipidemia, dementia, presents to the emergency department for nausea and vomiting. According to report the patient was diagnosed with urinary tract infection 3 days ago. Was placed on antibiotics but the patient has been nauseated with frequent episodes of vomiting over the past 3 days and has not been able to keep down any of the antibiotics. Here the patient is awake alert, no distress. She denies any complaints but also denies any vomiting or urinary symptoms. Denies being diagnosed with UTI. Patient has baseline dementia and cannot contribute much to her history at this time but does appear calm, comfortable, cooperative and pleasant.  Past Medical History:  Diagnosis Date  . CAD (coronary artery disease)    Native Vessel  . Hyperlipidemia, mixed   . Hypertension    unspecified  . Hypothyroidism   . Mild dementia   . Restless leg syndrome   . Vertigo     Patient Active Problem List   Diagnosis Date Noted  . Dizziness 05/13/2013  . Murmur, cardiac 05/13/2013  . CHEST PAIN UNSPECIFIED 05/09/2010  . Carotid stenosis 01/10/2010  . HYPERLIPIDEMIA-MIXED 08/17/2009  . Essential hypertension 08/17/2009  . CAD, NATIVE VESSEL 08/17/2009    Past Surgical History:  Procedure Laterality Date  . EYE SURGERY    . FOOT SURGERY      Prior to Admission medications   Medication Sig Start Date End Date Taking? Authorizing Provider  aspirin 81 MG tablet Take 81 mg by mouth daily.      [provider]  Cholecalciferol (VITAMIN D) 2000 UNITS tablet Take 2,000 Units by mouth daily.    [provider]  donepezil (ARICEPT) 10 MG tablet Take 10 mg by mouth at bedtime.    [provider]   hydrochlorothiazide (HYDRODIURIL) 25 MG tablet Take 25 mg by mouth daily.    [provider]  hydrOXYzine (ATARAX/VISTARIL) 25 MG tablet Take 25 mg by mouth 3 (three) times daily.    [provider]  ketoconazole (NIZORAL) 2 % shampoo Apply 1 application topically 3 (three) times a week.     [provider]  levothyroxine (SYNTHROID, LEVOTHROID) 75 MCG tablet Take 75 mcg by mouth daily before breakfast.    [provider]  mirtazapine (REMERON) 7.5 MG tablet Take 7.5 mg by mouth at bedtime.    [provider]  potassium chloride SA (K-DUR,KLOR-CON) 20 MEQ tablet Take 20 mEq by mouth daily.    [provider]  pravastatin (PRAVACHOL) 40 MG tablet Take 40 mg by mouth daily.    [provider]  risperiDONE (RISPERDAL) 0.25 MG tablet Take 0.25 mg by mouth daily.    [provider]  senna-docusate (SENOKOT-S) 8.6-50 MG per tablet Take 1 tablet by mouth 2 (two) times daily.     [provider]  venlafaxine XR (EFFEXOR-XR) 75 MG 24 hr capsule Take 75 mg by mouth daily with breakfast.     [provider]    Allergies  Allergen Reactions  . Evista [Raloxifene] Other (See Comments)    Reaction: unknown  . Shellfish Allergy Other (See Comments)    Reaction: unknown   . Sulfa Antibiotics Other (See Comments)    Reaction: unknown  Family History  Problem Relation Age of Onset  . Hypertension Mother   . Heart attack Mother 4860  . Heart disease Unknown     Social History Social History  Substance Use Topics  . Smoking status: Former Smoker    Packs/day: 1.00    Years: 20.00    Types: Cigarettes    Quit date: 12/10/1980  . Smokeless tobacco: Not on file  . Alcohol use No    Review of Systems Unable to obtain an adequate review of systems due to baseline dementia.  ____________________________________________   PHYSICAL EXAM:  VITAL SIGNS: ED Triage Vitals  Enc Vitals Group     BP 04/01/17  1338 (!) 142/61     Pulse Rate 04/01/17 1338 66     Resp 04/01/17 1338 16     Temp 04/01/17 1338 98.4 F (36.9 C)     Temp Source 04/01/17 1338 Oral     SpO2 04/01/17 1338 99 %     Weight 04/01/17 1339 120 lb (54.4 kg)     Height 04/01/17 1339 5\' 2"  (1.575 m)     Head Circumference --      Peak Flow --      Pain Score --      Pain Loc --      Pain Edu? --      Excl. in GC? --     Constitutional: Alert. Calm, cooperative, pleasant. No distress. Eyes: Normal exam ENT   Head: Normocephalic and atraumatic   Mouth/Throat: Mucous membranes are moist. Cardiovascular: Normal rate, regular rhythm. No murmur Respiratory: Normal respiratory effort without tachypnea nor retractions. Breath sounds are clear Gastrointestinal: Soft and nontender. No distention.  Musculoskeletal: Nontender with normal range of motion in all extremities. Neurologic:  Normal speech and language. No gross focal neurologic deficits Skin:  Skin is warm, dry and intact.  Psychiatric: Mood and affect are normal.  ____________________________________________   INITIAL IMPRESSION / ASSESSMENT AND PLAN / ED COURSE  Pertinent labs & imaging results that were available during my care of the patient were reviewed by me and considered in my medical decision making (see chart for details).  The patient presents to the emergency department with reports of a urinary tract infection diagnosed 3 days ago unable to keep down antibiotics due to vomiting. Overall the patient appears well currently, overall normal vitals, calm, pleasant and comfortable appearing. No distress. No complaints. Patient does have baseline dementia and cannot contribute to her history. Currently awaiting lab results. We'll IV hydrate and treat with IV Zofran in the emergency department.  The patient continues to appear well in the emergency department. Patient's labs are largely within normal limits. We are currently awaiting urinalysis results. We  will by mouth trial in the emergency department. Patient care signed out to oncoming physician. ____________________________________________   FINAL CLINICAL IMPRESSION(S) / ED DIAGNOSES  Nausea and vomiting    Minna AntisPaduchowski, Zayin Valadez, MD 04/01/17 1446

## 2017-04-03 LAB — URINE CULTURE: Culture: NO GROWTH

## 2017-05-02 ENCOUNTER — Other Ambulatory Visit: Payer: Self-pay | Admitting: Cardiology

## 2017-05-02 DIAGNOSIS — I6523 Occlusion and stenosis of bilateral carotid arteries: Secondary | ICD-10-CM

## 2017-07-25 NOTE — Telephone Encounter (Signed)
Patient Over Due for fu Carotid u/s per previous note she is not able to transport easily and her last appt for this test was cancelled .  Please advise if an attempt to rs should be made

## 2017-07-25 NOTE — Telephone Encounter (Signed)
Looks like she canceled appointments and will call back if needed. So we can take it out.

## 2017-09-19 ENCOUNTER — Emergency Department: Payer: Medicare Other

## 2017-09-19 ENCOUNTER — Emergency Department
Admission: EM | Admit: 2017-09-19 | Discharge: 2017-09-19 | Disposition: A | Discharge status: Home / Self Care | Payer: Medicare Other | Attending: Emergency Medicine | Admitting: Emergency Medicine

## 2017-09-19 ENCOUNTER — Other Ambulatory Visit: Payer: Self-pay

## 2017-09-19 DIAGNOSIS — I2510 Atherosclerotic heart disease of native coronary artery without angina pectoris: Secondary | ICD-10-CM | POA: Diagnosis not present

## 2017-09-19 DIAGNOSIS — I10 Essential (primary) hypertension: Secondary | ICD-10-CM | POA: Insufficient documentation

## 2017-09-19 DIAGNOSIS — Z7902 Long term (current) use of antithrombotics/antiplatelets: Secondary | ICD-10-CM | POA: Diagnosis not present

## 2017-09-19 DIAGNOSIS — Z7982 Long term (current) use of aspirin: Secondary | ICD-10-CM | POA: Diagnosis not present

## 2017-09-19 DIAGNOSIS — Z79899 Other long term (current) drug therapy: Secondary | ICD-10-CM | POA: Insufficient documentation

## 2017-09-19 DIAGNOSIS — W19XXXA Unspecified fall, initial encounter: Secondary | ICD-10-CM

## 2017-09-19 DIAGNOSIS — R58 Hemorrhage, not elsewhere classified: Secondary | ICD-10-CM | POA: Diagnosis not present

## 2017-09-19 DIAGNOSIS — R51 Headache: Secondary | ICD-10-CM | POA: Diagnosis present

## 2017-09-19 DIAGNOSIS — E039 Hypothyroidism, unspecified: Secondary | ICD-10-CM | POA: Insufficient documentation

## 2017-09-19 DIAGNOSIS — Z87891 Personal history of nicotine dependence: Secondary | ICD-10-CM | POA: Diagnosis not present

## 2017-09-19 NOTE — ED Notes (Signed)
ED Provider at bedside. 

## 2017-09-19 NOTE — ED Notes (Signed)
Pt daughter at bedside. EDP at bedside.

## 2017-09-19 NOTE — Discharge Instructions (Signed)
Please seek medical attention for any high fevers, chest pain, shortness of breath, change in behavior, persistent vomiting, bloody stool or any other new or concerning symptoms.  

## 2017-09-19 NOTE — ED Triage Notes (Signed)
[facility] Reported trip and fall. Pain to left elbow and right lower back. Pt has bruise to left elbow, left shoulder, abrasion to head. Pt hx of dementia. No reported LOC. EMS reports VSS.

## 2017-09-19 NOTE — ED Provider Notes (Signed)
[facility] Emergency Department Provider Note   ____________________________________________   I have reviewed the triage vital signs and the nursing notes.   HISTORY  Chief Complaint Fall   History limited by: Not Limited   HPI Carol Beck is a 83 y.o. female who presents to the emergency department today because of fall and injury.   LOCATION:left wrist, left elbow and head TIMING: occurred this morning SEVERITY: mild CONTEXT: patient states she was walking when she tripped on a rug. Hit her head without any LOC. Landed on her left arm.  MODIFYING FACTORS: no worsening pain or discomfort with movement of her left arm ASSOCIATED SYMPTOMS: denies any dizziness. Denies any chest pain. No shortness of breath.  Per medical record review patient has a history of mild dementia.  Past Medical History:  Diagnosis Date  . CAD (coronary artery disease)    [place]  . Hyperlipidemia, mixed   . Hypertension    unspecified  . Hypothyroidism   . Mild dementia   . Restless leg syndrome   . Vertigo     Patient Active Problem List   Diagnosis Date Noted  . Dizziness 05/13/2013  . Murmur, cardiac 05/13/2013  . CHEST PAIN UNSPECIFIED 05/09/2010  . Carotid stenosis 01/10/2010  . HYPERLIPIDEMIA-MIXED 08/17/2009  . Essential hypertension 08/17/2009  . CAD, NATIVE VESSEL 08/17/2009    Past Surgical History:  Procedure Laterality Date  . EYE SURGERY    . FOOT SURGERY      Prior to Admission medications   Medication Sig Start Date End Date Taking? Authorizing Provider  aspirin 81 MG tablet Take 81 mg by mouth daily.      [provider]  Cholecalciferol (VITAMIN D) 2000 UNITS tablet Take 2,000 Units by mouth daily.    [provider]  donepezil (ARICEPT) 10 MG tablet Take 10 mg by mouth at bedtime.    [provider]  hydrochlorothiazide (HYDRODIURIL) 25 MG tablet Take 25 mg by mouth daily.    [provider]  hydrOXYzine (ATARAX/VISTARIL) 25 MG tablet Take 25 mg by mouth 3 (three) times daily.    [provider]  ketoconazole (NIZORAL) 2 % shampoo Apply 1 application topically 3 (three) times a week.     [provider]  levothyroxine (SYNTHROID, LEVOTHROID) 75 MCG tablet Take 75 mcg by mouth daily before breakfast.    [provider]  mirtazapine (REMERON) 7.5 MG tablet Take 7.5 mg by mouth at bedtime.    [provider]  ondansetron (ZOFRAN ODT) 4 MG disintegrating tablet Take 1 tablet (4 mg total) by mouth every 8 (eight) hours as needed for nausea or vomiting. 04/01/17   [name], MD  potassium chloride SA (K-DUR,KLOR-CON) 20 MEQ tablet Take 20 mEq by mouth daily.    [provider]  pravastatin (PRAVACHOL) 40 MG tablet Take 40 mg by mouth daily.    [provider]  risperiDONE (RISPERDAL) 0.25 MG tablet Take 0.25 mg by mouth daily.    [provider]  senna-docusate (SENOKOT-S) 8.6-50 MG per tablet Take 1 tablet by mouth 2 (two) times daily.     [provider]  venlafaxine XR (EFFEXOR-XR) 75 MG 24 hr capsule Take 75 mg by mouth daily with breakfast.     [provider]    Allergies Evista [raloxifene]; Shellfish allergy; and Sulfa antibiotics  Family History  Problem Relation Age of Onset  . Hypertension Mother   . Heart attack Mother 37  .  Heart disease Unknown     Social History Social History   Tobacco Use  . Smoking status: Former Smoker    Packs/day: 1.00    Years: 20.00    Pack years: 20.00    Types: Cigarettes    Last attempt to quit: 12/10/1980    Years since quitting: 36.8  Substance Use Topics  . Alcohol use: No  . Drug use: No    Review of Systems Constitutional: No fever/chills Eyes: No visual changes. ENT: No sore throat. Cardiovascular: Denies chest pain. Respiratory: Denies shortness of breath. Gastrointestinal: No abdominal pain.  No nausea, no vomiting.  No  diarrhea.   Genitourinary: Negative for dysuria. Musculoskeletal: Negative for back pain. Skin: Positive for bruising to left elbow and wrist.  Neurological: Negative for headaches, focal weakness or numbness.  ____________________________________________   PHYSICAL EXAM:  VITAL SIGNS: ED Triage Vitals [09/19/17 0924]  Enc Vitals Group     BP (!) 128/58     Pulse Rate 69     Resp 18     Temp 98.2 F (36.8 C)     Temp Source Oral     SpO2 100 %     Weight 125 lb (56.7 kg)     Height 5\' 3"  (1.6 m)     Head Circumference      Peak Flow      Pain Score 4     Pain Loc      Pain Edu?      Excl. in GC?      Constitutional: Alert and oriented. Well appearing and in no distress. Eyes: Conjunctivae are normal.  ENT   Head: Normocephalic and atraumatic.   Nose: No congestion/rhinnorhea.   Mouth/Throat: Mucous membranes are moist.   Neck: No stridor. No midline tenderness.  Hematological/Lymphatic/Immunilogical: No cervical lymphadenopathy. Cardiovascular: Normal rate, regular rhythm.  No murmurs, rubs, or gallops.  Respiratory: Normal respiratory effort without tachypnea nor retractions. Breath sounds are clear and equal bilaterally. No wheezes/rales/rhonchi. Gastrointestinal: Soft and non tender. No rebound. No guarding.  Genitourinary: Deferred Musculoskeletal: Normal range of motion in all extremities. No lower extremity edema. No tenderness to palpation or manipulation of left arm Neurologic:  Normal speech and language. No gross focal neurologic deficits are appreciated.  Skin:  Small bruise noted over left elbow and wrist.  Psychiatric: Mood and affect are normal. Speech and behavior are normal. Patient exhibits appropriate insight and judgment.  ____________________________________________    LABS (pertinent positives/negatives)  None  ____________________________________________   EKG  None  ____________________________________________     RADIOLOGY  CT head No acute abnormality  ____________________________________________   PROCEDURES  Procedures  ____________________________________________   INITIAL IMPRESSION / ASSESSMENT AND PLAN / ED COURSE  Pertinent labs & imaging results that were available during my care of the patient were reviewed by me and considered in my medical decision making (see chart for details).  Patient presents because of concern for fall and injury. Patient does have some bruising over the left arm but it is non tender. Head ct was performed to evaluate for fracture or bleed but no abnormality found. Discussed with patient and family. Discussed that we could perform blood work however family and patient feel comfortable deferring at this time. Discussed return precautions.  ____________________________________________   FINAL CLINICAL IMPRESSION(S) / ED DIAGNOSES  Final diagnoses:  Fall, initial encounter  Ecchymosis     Note: This dictation was prepared with Dragon dictation. Any transcriptional errors that result from this process are unintentional  Phineas SemenGoodman, Damarkus Balis, MD 09/19/17 1101

## 2018-06-30 ENCOUNTER — Emergency Department: Payer: Medicare Other

## 2018-06-30 ENCOUNTER — Emergency Department
Admission: EM | Admit: 2018-06-30 | Discharge: 2018-07-01 | Disposition: A | Discharge status: Home / Self Care | Payer: Medicare Other | Attending: Emergency Medicine | Admitting: Emergency Medicine

## 2018-06-30 ENCOUNTER — Encounter: Payer: Self-pay | Admitting: Emergency Medicine

## 2018-06-30 ENCOUNTER — Other Ambulatory Visit: Payer: Self-pay

## 2018-06-30 DIAGNOSIS — S0990XA Unspecified injury of head, initial encounter: Secondary | ICD-10-CM | POA: Diagnosis present

## 2018-06-30 DIAGNOSIS — S42032A Displaced fracture of lateral end of left clavicle, initial encounter for closed fracture: Secondary | ICD-10-CM | POA: Diagnosis not present

## 2018-06-30 DIAGNOSIS — Y998 Other external cause status: Secondary | ICD-10-CM | POA: Insufficient documentation

## 2018-06-30 DIAGNOSIS — F0390 Unspecified dementia without behavioral disturbance: Secondary | ICD-10-CM | POA: Insufficient documentation

## 2018-06-30 DIAGNOSIS — I2510 Atherosclerotic heart disease of native coronary artery without angina pectoris: Secondary | ICD-10-CM | POA: Insufficient documentation

## 2018-06-30 DIAGNOSIS — Y9389 Activity, other specified: Secondary | ICD-10-CM | POA: Insufficient documentation

## 2018-06-30 DIAGNOSIS — T07XXXA Unspecified multiple injuries, initial encounter: Secondary | ICD-10-CM

## 2018-06-30 DIAGNOSIS — Y92120 Kitchen in nursing home as the place of occurrence of the external cause: Secondary | ICD-10-CM | POA: Diagnosis not present

## 2018-06-30 DIAGNOSIS — I10 Essential (primary) hypertension: Secondary | ICD-10-CM | POA: Diagnosis not present

## 2018-06-30 DIAGNOSIS — W01198A Fall on same level from slipping, tripping and stumbling with subsequent striking against other object, initial encounter: Secondary | ICD-10-CM | POA: Diagnosis not present

## 2018-06-30 DIAGNOSIS — E039 Hypothyroidism, unspecified: Secondary | ICD-10-CM | POA: Insufficient documentation

## 2018-06-30 DIAGNOSIS — S0083XA Contusion of other part of head, initial encounter: Secondary | ICD-10-CM | POA: Diagnosis not present

## 2018-06-30 DIAGNOSIS — Z87891 Personal history of nicotine dependence: Secondary | ICD-10-CM | POA: Insufficient documentation

## 2018-06-30 DIAGNOSIS — S61411A Laceration without foreign body of right hand, initial encounter: Secondary | ICD-10-CM | POA: Insufficient documentation

## 2018-06-30 DIAGNOSIS — Z7982 Long term (current) use of aspirin: Secondary | ICD-10-CM | POA: Diagnosis not present

## 2018-06-30 DIAGNOSIS — W19XXXA Unspecified fall, initial encounter: Secondary | ICD-10-CM

## 2018-06-30 DIAGNOSIS — Z79899 Other long term (current) drug therapy: Secondary | ICD-10-CM | POA: Insufficient documentation

## 2018-06-30 LAB — CBC WITH DIFFERENTIAL/PLATELET
Basophils Absolute: 0.1 10*3/uL (ref 0–0.1)
Basophils Relative: 1 %
Eosinophils Absolute: 0.1 10*3/uL (ref 0–0.7)
Eosinophils Relative: 1 %
HCT: 30.1 % — ABNORMAL LOW (ref 35.0–47.0)
Hemoglobin: 10.1 g/dL — ABNORMAL LOW (ref 12.0–16.0)
Lymphocytes Relative: 13 %
Lymphs Abs: 1.7 10*3/uL (ref 1.0–3.6)
MCH: 29.6 pg (ref 26.0–34.0)
MCHC: 33.6 g/dL (ref 32.0–36.0)
MCV: 87.9 fL (ref 80.0–100.0)
Monocytes Absolute: 1.4 10*3/uL — ABNORMAL HIGH (ref 0.2–0.9)
Monocytes Relative: 10 %
Neutro Abs: 10.3 10*3/uL — ABNORMAL HIGH (ref 1.4–6.5)
Neutrophils Relative %: 75 %
Platelets: 285 10*3/uL (ref 150–440)
RBC: 3.42 MIL/uL — ABNORMAL LOW (ref 3.80–5.20)
RDW: 15.1 % — ABNORMAL HIGH (ref 11.5–14.5)
WBC: 13.5 10*3/uL — ABNORMAL HIGH (ref 3.6–11.0)

## 2018-06-30 LAB — COMPREHENSIVE METABOLIC PANEL
ALT: 14 U/L (ref 0–44)
AST: 24 U/L (ref 15–41)
Albumin: 3.8 g/dL (ref 3.5–5.0)
Alkaline Phosphatase: 72 U/L (ref 38–126)
Anion gap: 7 (ref 5–15)
BUN: 22 mg/dL (ref 8–23)
CO2: 23 mmol/L (ref 22–32)
Calcium: 9 mg/dL (ref 8.9–10.3)
Chloride: 106 mmol/L (ref 98–111)
Creatinine, Ser: 1.02 mg/dL — ABNORMAL HIGH (ref 0.44–1.00)
GFR calc Af Amer: 55 mL/min — ABNORMAL LOW (ref >60)
GFR calc non Af Amer: 48 mL/min — ABNORMAL LOW (ref >60)
Glucose, Bld: 133 mg/dL — ABNORMAL HIGH (ref 70–99)
Potassium: 3.7 mmol/L (ref 3.5–5.1)
Sodium: 136 mmol/L (ref 135–145)
Total Bilirubin: 0.7 mg/dL (ref 0.3–1.2)
Total Protein: 7.4 g/dL (ref 6.5–8.1)

## 2018-06-30 LAB — LIPASE, BLOOD: Lipase: 43 U/L (ref 11–51)

## 2018-06-30 LAB — TROPONIN I: Troponin I: <0.03 ng/mL (ref <0.03)

## 2018-06-30 NOTE — ED Notes (Signed)
Report given Greg RN.

## 2018-06-30 NOTE — ED Provider Notes (Signed)
[facility] Emergency Department Provider Note  ____________________________________________   First MD Initiated Contact with Patient 06/30/18 2253     (approximate)  I have reviewed the triage vital signs and the nursing notes.   HISTORY  Chief Complaint Fall  Level 5 caveat:  history/ROS limited by chronic dementia  HPI Carol Beck is a 84 y.o. female with medical history as listed below which notably includes at least mild dementia who presents from her nursing facility by EMS for evaluation after a fall.  She reports that she was walking into the kitchen and slipped and fell.  She denies loss of consciousness.  She struck her head and apparently her left shoulder or arm on the ground, she is not entirely certain what happened.  She does not remember specifically, but medical records indicate she also had a fall 2 days ago for which she came to the emergency department.  She takes aspirin but no other blood thinners.  She is denying pain in her head except for on the right forehead where she has a large hematoma.  She also says that her two front teeth are loose.  She has some pain in her right hand with a skin tear.  She is also reporting pain in her left shoulder with any amount of movement.  Movement makes the shoulder pain worse, staying still makes it better.  She is alert and oriented to person and location and is conversant and does not appear to be in acute distress.  She denies chest pain, shortness of breath, nausea, vomiting, abdominal pain, and any pain in her legs.  Past Medical History:  Diagnosis Date  . CAD (coronary artery disease)    Native Vessel  . Hyperlipidemia, mixed   . Hypertension    unspecified  . Hypothyroidism   . Mild dementia   . Restless leg syndrome   . Vertigo     Patient Active Problem List   Diagnosis Date Noted  . Dizziness 05/13/2013  . Murmur, cardiac 05/13/2013  . CHEST PAIN UNSPECIFIED 05/09/2010  .  Carotid stenosis 01/10/2010  . HYPERLIPIDEMIA-MIXED 08/17/2009  . Essential hypertension 08/17/2009  . CAD, NATIVE VESSEL 08/17/2009    Past Surgical History:  Procedure Laterality Date  . EYE SURGERY    . FOOT SURGERY      Prior to Admission medications   Medication Sig Start Date End Date Taking? Authorizing Provider  aspirin 81 MG tablet Take 81 mg by mouth daily.      [provider]  Cholecalciferol (VITAMIN D) 2000 UNITS tablet Take 2,000 Units by mouth daily.    [provider]  donepezil (ARICEPT) 10 MG tablet Take 10 mg by mouth at bedtime.    [provider]  hydrochlorothiazide (HYDRODIURIL) 25 MG tablet Take 25 mg by mouth daily.    [provider]  hydrOXYzine (ATARAX/VISTARIL) 25 MG tablet Take 25 mg by mouth 3 (three) times daily.    [provider]  ketoconazole (NIZORAL) 2 % shampoo Apply 1 application topically 3 (three) times a week.     [provider]  levothyroxine (SYNTHROID, LEVOTHROID) 75 MCG tablet Take 75 mcg by mouth daily before breakfast.    [provider]  mirtazapine (REMERON) 7.5 MG tablet Take 7.5 mg by mouth at bedtime.    [provider]  ondansetron (ZOFRAN ODT) 4 MG disintegrating tablet Take 1 tablet (4 mg total) by mouth every 8 (eight) hours as needed for nausea or vomiting.  04/01/17   [name], MD  potassium chloride SA (K-DUR,KLOR-CON) 20 MEQ tablet Take 20 mEq by mouth daily.    [provider]  pravastatin (PRAVACHOL) 40 MG tablet Take 40 mg by mouth daily.    [provider]  risperiDONE (RISPERDAL) 0.25 MG tablet Take 0.25 mg by mouth daily.    [provider]  senna-docusate (SENOKOT-S) 8.6-50 MG per tablet Take 1 tablet by mouth 2 (two) times daily.     [provider]  venlafaxine XR (EFFEXOR-XR) 75 MG 24 hr capsule Take 75 mg by mouth daily with breakfast.     [provider]    Allergies Evista [raloxifene];  Shellfish allergy; and Sulfa antibiotics  Family History  Problem Relation Age of Onset  . Hypertension Mother   . Heart attack Mother 50  . Heart disease Unknown     Social History Social History   Tobacco Use  . Smoking status: Former Smoker    Packs/day: 1.00    Years: 20.00    Pack years: 20.00    Types: Cigarettes    Last attempt to quit: 12/10/1980    Years since quitting: 37.5  Substance Use Topics  . Alcohol use: No  . Drug use: No    Review of Systems Level 5 caveat:  history/ROS limited by chronic dementia  Constitutional: No fever/chills Eyes: No visual changes. ENT: No sore throat. Cardiovascular: Denies chest pain. Respiratory: Denies shortness of breath. Gastrointestinal: No abdominal pain.  No nausea, no vomiting.  No diarrhea.  No constipation. Genitourinary: Negative for dysuria. Musculoskeletal: Denies neck and back pain.  Pain in left shoulder, pain in right hand.  Large hematoma on right forehead Integumentary: Negative for rash. Neurological: Negative for headaches, focal weakness or numbness.   ____________________________________________   PHYSICAL EXAM:  VITAL SIGNS: ED Triage Vitals  Enc Vitals Group     BP 06/30/18 2246 97/80     Pulse Rate 06/30/18 2246 78     Resp 06/30/18 2246 19     Temp 06/30/18 2246 98.4 F (36.9 C)     Temp Source 06/30/18 2246 Oral     SpO2 06/30/18 2246 97 %     Weight 06/30/18 2247 45.4 kg (100 lb)     Height 06/30/18 2247 1.575 m (5\' 2" )     Head Circumference --      Peak Flow --      Pain Score 06/30/18 2247 10     Pain Loc --      Pain Edu? --      Excl. in GC? --     Constitutional: Alert and oriented to person and place.  Multiple obvious traumatic injuries but is not in acute distress. Eyes: Conjunctivae are normal. PERRL. EOMI. Head: Very large excised ecchymotic hematoma on the right forehead, mild abrasion, no laceration. Nose: No congestion/rhinnorhea. Mouth/Throat: Mucous membranes are  moist.  The patient is mostly edentulous and the 2 remaining teeth, teeth numbers 8 and 9 (top center incisors) are unstable.  No active bleeding, tender to palpation.  There are no surrounding teeth against which to anchor the remaining teeth.  No lip injury is present, no laceration requiring repair.  No tenderness to palpation of the mandible or maxilla on either side. Neck: No stridor.  No meningeal signs.  No cervical spine tenderness to palpation.  Patient is able to rotate and flex and extend her head without any reproduction of pain. Cardiovascular: Normal rate, regular rhythm. Good peripheral circulation.  Grossly normal heart sounds. Respiratory: Normal respiratory effort.  No retractions. Lungs CTAB. Gastrointestinal: Soft and nontender. No distention.  Musculoskeletal: Left shoulder is tender to palpation and worse with range of motion of the shoulder with extension of the arm overhead and side to side.  Distal clavicle is tender to palpation.  At the Neurologic:  Normal speech and language. No gross focal neurologic deficits are appreciated.  Skin:  Skin is warm, dry and intact.  Very large area of ecchymosis, likely both acute and subacute, all around the left shoulder and left superior anterior chest wall..  Skin tear to the ulnar side of her right hand, superficial.  The patient has multiple acute and subacute ecchymoses on most of her extremities of varying ages indicating prior falls.   ____________________________________________   LABS (all labs ordered are listed, but only abnormal results are displayed)  Labs Reviewed  CBC WITH DIFFERENTIAL/PLATELET - Abnormal; Notable for the following components:      Result Value   WBC 13.5 (*)    RBC 3.42 (*)    Hemoglobin 10.1 (*)    HCT 30.1 (*)    RDW 15.1 (*)    Neutro Abs 10.3 (*)    Monocytes Absolute 1.4 (*)    All other components within normal limits  COMPREHENSIVE METABOLIC PANEL - Abnormal; Notable for the following  components:   Glucose, Bld 133 (*)    Creatinine, Ser 1.02 (*)    GFR calc non Af Amer 48 (*)    GFR calc Af Amer 55 (*)    All other components within normal limits  URINALYSIS, ROUTINE W REFLEX MICROSCOPIC - Abnormal; Notable for the following components:   Color, Urine YELLOW (*)    APPearance CLEAR (*)    Ketones, ur 5 (*)    Protein, ur 30 (*)    All other components within normal limits  TROPONIN I  LIPASE, BLOOD   ____________________________________________  EKG  ED ECG REPORT I, Loleta Rose, the attending physician, personally viewed and interpreted this ECG.  Date: 06/30/2018 EKG Time: 22: 49 Rate: 77 Rhythm: normal sinus rhythm QRS Axis: normal Intervals: normal ST/T Wave abnormalities: Non-specific ST segment / T-wave changes, but no evidence of acute ischemia. Narrative Interpretation: no evidence of acute ischemia   ____________________________________________  RADIOLOGY I, Loleta Rose, personally viewed and evaluated these images (plain radiographs) as part of my medical decision making, as well as reviewing the written report by the radiologist.  ED MD interpretation: No acute injury noted on portable chest x-ray or shoulder x-ray except for an acute left distal clavicle fracture  Official radiology report(s): Ct Head Wo Contrast  Result Date: 07/01/2018 CLINICAL DATA:  84 year old female with fall and trauma to the head. EXAM: CT HEAD WITHOUT CONTRAST CT MAXILLOFACIAL WITHOUT CONTRAST CT CERVICAL SPINE WITHOUT CONTRAST TECHNIQUE: Multidetector CT imaging of the head, cervical spine, and maxillofacial structures were performed using the standard protocol without intravenous contrast. Multiplanar CT image reconstructions of the cervical spine and maxillofacial structures were also generated. COMPARISON:  Head CT dated 09/19/2017 FINDINGS: CT HEAD FINDINGS Brain: There is mild age-related atrophy and chronic microvascular ischemic changes. Tiny hypodense focus  in the left lentiform nucleus likely related to an old lacunar infarct. There is no acute intracranial hemorrhage. No mass effect or midline shift. No extra-axial fluid collection. Vascular: No hyperdense vessel or unexpected calcification. Skull: Normal. Negative for fracture or focal lesion. Other: Right forehead contusion. Evaluation of this exam is limited due to motion  artifact. CT MAXILLOFACIAL FINDINGS Osseous: No definite acute fracture identified. There is incomplete attachment of the maxillary teeth to the bone. Clinical correlation is recommended to evaluate for fracture or loosening. Orbits: The globes and retro-orbital fat are preserved. Foci of calcification along the scleral similar to prior CT. Sinuses: Clear. Soft tissues: Negative. CT CERVICAL SPINE FINDINGS Alignment: No acute subluxation. Skull base and vertebrae: No acute fracture.  Osteopenia. Soft tissues and spinal canal: No prevertebral fluid or swelling. No visible canal hematoma. Disc levels: Multilevel degenerative changes with disc space narrowing and endplate irregularity most prominent at C5-C6. Upper chest: Areas of air trapping in the visualized upper lungs. Other: None IMPRESSION: 1. No acute intracranial hemorrhage. 2. No acute/traumatic cervical spine pathology. 3. No facial bone fractures. 4. Loose appearance of the maxillary teeth. Clinical correlation is recommended. Electronically Signed   By: Elgie Collard M.D.   On: 07/01/2018 00:03   Ct Cervical Spine Wo Contrast  Result Date: 07/01/2018 CLINICAL DATA:  84 year old female with fall and trauma to the head. EXAM: CT HEAD WITHOUT CONTRAST CT MAXILLOFACIAL WITHOUT CONTRAST CT CERVICAL SPINE WITHOUT CONTRAST TECHNIQUE: Multidetector CT imaging of the head, cervical spine, and maxillofacial structures were performed using the standard protocol without intravenous contrast. Multiplanar CT image reconstructions of the cervical spine and maxillofacial structures were also  generated. COMPARISON:  Head CT dated 09/19/2017 FINDINGS: CT HEAD FINDINGS Brain: There is mild age-related atrophy and chronic microvascular ischemic changes. Tiny hypodense focus in the left lentiform nucleus likely related to an old lacunar infarct. There is no acute intracranial hemorrhage. No mass effect or midline shift. No extra-axial fluid collection. Vascular: No hyperdense vessel or unexpected calcification. Skull: Normal. Negative for fracture or focal lesion. Other: Right forehead contusion. Evaluation of this exam is limited due to motion artifact. CT MAXILLOFACIAL FINDINGS Osseous: No definite acute fracture identified. There is incomplete attachment of the maxillary teeth to the bone. Clinical correlation is recommended to evaluate for fracture or loosening. Orbits: The globes and retro-orbital fat are preserved. Foci of calcification along the scleral similar to prior CT. Sinuses: Clear. Soft tissues: Negative. CT CERVICAL SPINE FINDINGS Alignment: No acute subluxation. Skull base and vertebrae: No acute fracture.  Osteopenia. Soft tissues and spinal canal: No prevertebral fluid or swelling. No visible canal hematoma. Disc levels: Multilevel degenerative changes with disc space narrowing and endplate irregularity most prominent at C5-C6. Upper chest: Areas of air trapping in the visualized upper lungs. Other: None IMPRESSION: 1. No acute intracranial hemorrhage. 2. No acute/traumatic cervical spine pathology. 3. No facial bone fractures. 4. Loose appearance of the maxillary teeth. Clinical correlation is recommended. Electronically Signed   By: Elgie Collard M.D.   On: 07/01/2018 00:03   Dg Chest Portable 1 View  Result Date: 06/30/2018 CLINICAL DATA:  Short of breath, fall shoulder pain EXAM: PORTABLE CHEST 1 VIEW COMPARISON:  Report 05/30/2004 FINDINGS: No acute opacity or pleural effusion. Normal cardiomediastinal silhouette. Aortic atherosclerosis. No pneumothorax. Acute displaced fracture  distal left clavicle. IMPRESSION: No active disease.  Acute displaced distal left clavicle fracture. Electronically Signed   By: Jasmine Pang M.D.   On: 06/30/2018 23:29   Dg Shoulder Left  Result Date: 06/30/2018 CLINICAL DATA:  Pain, status post fall EXAM: LEFT SHOULDER - 2+ VIEW COMPARISON:  None. FINDINGS: No humeral head dislocation. Acute fracture distal shaft of the clavicle with close to 1 bone with of inferior displacement of proximal fracture fragment. The [facility] joint appears intact. IMPRESSION: Acute displaced distal clavicle  fracture. Electronically Signed   By: Jasmine Pang M.D.   On: 06/30/2018 23:28   Ct Maxillofacial Wo Contrast  Result Date: 07/01/2018 CLINICAL DATA:  84 year old female with fall and trauma to the head. EXAM: CT HEAD WITHOUT CONTRAST CT MAXILLOFACIAL WITHOUT CONTRAST CT CERVICAL SPINE WITHOUT CONTRAST TECHNIQUE: Multidetector CT imaging of the head, cervical spine, and maxillofacial structures were performed using the standard protocol without intravenous contrast. Multiplanar CT image reconstructions of the cervical spine and maxillofacial structures were also generated. COMPARISON:  Head CT dated 09/19/2017 FINDINGS: CT HEAD FINDINGS Brain: There is mild age-related atrophy and chronic microvascular ischemic changes. Tiny hypodense focus in the left lentiform nucleus likely related to an old lacunar infarct. There is no acute intracranial hemorrhage. No mass effect or midline shift. No extra-axial fluid collection. Vascular: No hyperdense vessel or unexpected calcification. Skull: Normal. Negative for fracture or focal lesion. Other: Right forehead contusion. Evaluation of this exam is limited due to motion artifact. CT MAXILLOFACIAL FINDINGS Osseous: No definite acute fracture identified. There is incomplete attachment of the maxillary teeth to the bone. Clinical correlation is recommended to evaluate for fracture or loosening. Orbits: The globes and retro-orbital fat are  preserved. Foci of calcification along the scleral similar to prior CT. Sinuses: Clear. Soft tissues: Negative. CT CERVICAL SPINE FINDINGS Alignment: No acute subluxation. Skull base and vertebrae: No acute fracture.  Osteopenia. Soft tissues and spinal canal: No prevertebral fluid or swelling. No visible canal hematoma. Disc levels: Multilevel degenerative changes with disc space narrowing and endplate irregularity most prominent at C5-C6. Upper chest: Areas of air trapping in the visualized upper lungs. Other: None IMPRESSION: 1. No acute intracranial hemorrhage. 2. No acute/traumatic cervical spine pathology. 3. No facial bone fractures. 4. Loose appearance of the maxillary teeth. Clinical correlation is recommended. Electronically Signed   By: Elgie Collard M.D.   On: 07/01/2018 00:03    ____________________________________________   PROCEDURES  Critical Care performed: No   Procedure(s) performed:   Procedures   ____________________________________________   INITIAL IMPRESSION / ASSESSMENT AND PLAN / ED COURSE  As part of my medical decision making, I reviewed the following data within the electronic MEDICAL RECORD NUMBER History obtained from family, Nursing notes reviewed and incorporated, Labs reviewed , EKG interpreted , Old chart reviewed, Radiograph reviewed  and Notes from prior ED visits    Differential diagnosis includes, but is not limited to, mechanical fall, acute infectious process such as UTI or pneumonia, cardiogenic syncope, chronic gait instability.  In terms of her injury she could have intracranial bleeding, cervical spine fracture, left shoulder dislocation or fracture.  Very low suspicion for injury to the hand other than skin tear.  Steri-Strips will be applied to the skin tear.  Broad laboratory evaluation is pending including standard blood work, chest x-ray, left shoulder x-ray, and CT scans of the head, maxillofacial, and cervical spine.  The patient is not in  any distress and is hemodynamically stable in spite of her obvious traumatic injuries.  Clinical Course as of Jul 01 326  Tue Jul 01, 2018  0040 The patient's work-up was reassuring.  She has the left distal clavicle fracture but no other fractures or dislocations were identified on chest x-ray, shoulder x-ray, nor on the CT scans of her head, face, and cervical spine.  No acute intracranial bleeding.  Lab work is notable for a mild leukocytosis of 13.5 but no other abnormalities on lipase, conference of metabolic panel, nor troponin.  She has been asymptomatic  and in no distress while resting in her exam bed.  We will perform in and out catheterization for urine given the mild leukocytosis which likely is only a stress reaction to her fall.  I looked back through the record and her prior urinalysis demonstrated a few white blood cells but there is no growth on the urine culture.  Unless there is a definitive UTI on the urinalysis, I will hold off on treatment but will instead send a culture given that I do not want to be put her at risk for the side effects of unnecessary antibiotics.  She has no admittable diagnoses at this time and will be discharged back to her facility with a left arm sling and orthopedics follow-up.  Steri-Strips were placed by the nurse on the skin tear on her right hand.   [CF]  0135 No UTI, will contact daughter by phone and plan for d/c back to facility  Urinalysis, Routine w reflex microscopic(!) [CF]  0148 Spoke with daughter,    [CF]    Clinical Course User Index [CF] Loleta Rose, MD    ____________________________________________  FINAL CLINICAL IMPRESSION(S) / ED DIAGNOSES  Final diagnoses:  Fall, initial encounter  Traumatic closed fracture of distal clavicle with minimal displacement, left, initial encounter  Multiple contusions  Skin tear of right hand without complication, initial encounter  Forehead contusion, initial encounter  Traumatic hematoma of  forehead, initial encounter     MEDICATIONS GIVEN DURING THIS VISIT:  Medications - No data to display   ED Discharge Orders    None       Note:  This document was prepared using Dragon voice recognition software and may include unintentional dictation errors.    Loleta Rose, MD 07/01/18 [phone]

## 2018-06-30 NOTE — ED Triage Notes (Signed)
Pt presents to ed via [facility] with c/o of fall on saturday and today. Pt is not currently on blood thinners, only takes asprin. Skin tear noted to left knee and right hand. Pt states that two front teeth are now loose and also complains of sore left shoulder. Pt also fell tonight and there is noted hematoma to right side of head. Hx of dementia. Alert and oriented x1 for EMS.

## 2018-07-01 DIAGNOSIS — S42032A Displaced fracture of lateral end of left clavicle, initial encounter for closed fracture: Secondary | ICD-10-CM | POA: Diagnosis not present

## 2018-07-01 LAB — URINALYSIS, ROUTINE W REFLEX MICROSCOPIC
Bacteria, UA: NONE SEEN
Bilirubin Urine: NEGATIVE
Glucose, UA: NEGATIVE mg/dL
Hgb urine dipstick: NEGATIVE
Ketones, ur: 5 mg/dL — AB
Leukocytes, UA: NEGATIVE
Nitrite: NEGATIVE
Protein, ur: 30 mg/dL — AB
Specific Gravity, Urine: 1.02 (ref 1.005–1.030)
Squamous Epithelial / LPF: NONE SEEN (ref 0–5)
pH: 5 (ref 5.0–8.0)

## 2018-07-01 NOTE — Discharge Instructions (Signed)
You have been seen in the Emergency Department (ED) today for a fall.  You do have a fracture to your collarbone (the clavicle) on the left side, but this is not something that requires surgery.  Please use the splint provided as much as possible to take the weight off of your shoulder and follow-up with orthopedics as recommended.  Please take over-the-counter ibuprofen and/or Tylenol as needed for your pain (unless you have an allergy or your doctor as told you not to take them), or take any prescribed medication as instructed.  Please follow up with your doctor regarding today's Emergency Department (ED) visit and your recent fall.    Return to the ED if you have any headache, confusion, slurred speech, weakness/numbness of any arm or leg, or any increased pain.

## 2018-07-01 NOTE — ED Notes (Signed)
Wound to right lateral hand cleansed and steri-strips applied.

## 2018-07-11 ENCOUNTER — Other Ambulatory Visit: Payer: Self-pay

## 2018-07-11 ENCOUNTER — Emergency Department
Admission: EM | Admit: 2018-07-11 | Discharge: 2018-07-12 | Disposition: A | Discharge status: Home / Self Care | Payer: Medicare Other | Attending: Emergency Medicine | Admitting: Emergency Medicine

## 2018-07-11 ENCOUNTER — Emergency Department: Payer: Medicare Other

## 2018-07-11 ENCOUNTER — Encounter: Payer: Self-pay | Admitting: Emergency Medicine

## 2018-07-11 DIAGNOSIS — S5002XA Contusion of left elbow, initial encounter: Secondary | ICD-10-CM | POA: Diagnosis not present

## 2018-07-11 DIAGNOSIS — S7002XA Contusion of left hip, initial encounter: Secondary | ICD-10-CM | POA: Insufficient documentation

## 2018-07-11 DIAGNOSIS — Y92002 Bathroom of unspecified non-institutional (private) residence single-family (private) house as the place of occurrence of the external cause: Secondary | ICD-10-CM | POA: Diagnosis not present

## 2018-07-11 DIAGNOSIS — I10 Essential (primary) hypertension: Secondary | ICD-10-CM | POA: Insufficient documentation

## 2018-07-11 DIAGNOSIS — S0990XA Unspecified injury of head, initial encounter: Secondary | ICD-10-CM | POA: Diagnosis not present

## 2018-07-11 DIAGNOSIS — Y939 Activity, unspecified: Secondary | ICD-10-CM | POA: Diagnosis not present

## 2018-07-11 DIAGNOSIS — E039 Hypothyroidism, unspecified: Secondary | ICD-10-CM | POA: Diagnosis not present

## 2018-07-11 DIAGNOSIS — Z7982 Long term (current) use of aspirin: Secondary | ICD-10-CM | POA: Insufficient documentation

## 2018-07-11 DIAGNOSIS — F0390 Unspecified dementia without behavioral disturbance: Secondary | ICD-10-CM | POA: Insufficient documentation

## 2018-07-11 DIAGNOSIS — I2510 Atherosclerotic heart disease of native coronary artery without angina pectoris: Secondary | ICD-10-CM | POA: Insufficient documentation

## 2018-07-11 DIAGNOSIS — Z87891 Personal history of nicotine dependence: Secondary | ICD-10-CM | POA: Insufficient documentation

## 2018-07-11 DIAGNOSIS — W19XXXA Unspecified fall, initial encounter: Secondary | ICD-10-CM | POA: Diagnosis not present

## 2018-07-11 DIAGNOSIS — Y999 Unspecified external cause status: Secondary | ICD-10-CM | POA: Diagnosis not present

## 2018-07-11 DIAGNOSIS — Z79899 Other long term (current) drug therapy: Secondary | ICD-10-CM | POA: Insufficient documentation

## 2018-07-11 NOTE — ED Provider Notes (Signed)
[facility] Emergency Department Provider Note ____________________________________________   First MD Initiated Contact with Patient 07/11/18 2236     (approximate)  I have reviewed the triage vital signs and the nursing notes.   HISTORY  Chief Complaint Fall    HPI Carol Beck is a 84 y.o. female with PMH as noted below who presents with head injury after a mechanical fall from standing height.  She states she fell in the bathroom although does not remember exactly how it happened, but she denies feeling dizzy or weak before it.  She reports hitting her head and her left elbow.  She also has some pain in her hip.  Past Medical History:  Diagnosis Date  . CAD (coronary artery disease)    Native Vessel  . Hyperlipidemia, mixed   . Hypertension    unspecified  . Hypothyroidism   . Mild dementia   . Restless leg syndrome   . Vertigo     Patient Active Problem List   Diagnosis Date Noted  . Dizziness 05/13/2013  . Murmur, cardiac 05/13/2013  . CHEST PAIN UNSPECIFIED 05/09/2010  . Carotid stenosis 01/10/2010  . HYPERLIPIDEMIA-MIXED 08/17/2009  . Essential hypertension 08/17/2009  . CAD, NATIVE VESSEL 08/17/2009    Past Surgical History:  Procedure Laterality Date  . EYE SURGERY    . FOOT SURGERY      Prior to Admission medications   Medication Sig Start Date End Date Taking? Authorizing Provider  aspirin 81 MG tablet Take 81 mg by mouth daily.      [provider]  Cholecalciferol (VITAMIN D) 2000 UNITS tablet Take 2,000 Units by mouth daily.    [provider]  donepezil (ARICEPT) 10 MG tablet Take 10 mg by mouth at bedtime.    [provider]  hydrochlorothiazide (HYDRODIURIL) 25 MG tablet Take 25 mg by mouth daily.    [provider]  hydrOXYzine (ATARAX/VISTARIL) 25 MG tablet Take 25 mg by mouth 3 (three) times daily.    [provider]  ketoconazole (NIZORAL) 2 % shampoo Apply 1  application topically 3 (three) times a week.     [provider]  levothyroxine (SYNTHROID, LEVOTHROID) 75 MCG tablet Take 75 mcg by mouth daily before breakfast.    [provider]  mirtazapine (REMERON) 7.5 MG tablet Take 7.5 mg by mouth at bedtime.    [provider]  ondansetron (ZOFRAN ODT) 4 MG disintegrating tablet Take 1 tablet (4 mg total) by mouth every 8 (eight) hours as needed for nausea or vomiting. 04/01/17   [name], MD  potassium chloride SA (K-DUR,KLOR-CON) 20 MEQ tablet Take 20 mEq by mouth daily.    [provider]  pravastatin (PRAVACHOL) 40 MG tablet Take 40 mg by mouth daily.    [provider]  risperiDONE (RISPERDAL) 0.25 MG tablet Take 0.25 mg by mouth daily.    [provider]  senna-docusate (SENOKOT-S) 8.6-50 MG per tablet Take 1 tablet by mouth 2 (two) times daily.     [provider]  venlafaxine XR (EFFEXOR-XR) 75 MG 24 hr capsule Take 75 mg by mouth daily with breakfast.     [provider]    Allergies Evista [raloxifene]; Shellfish allergy; and Sulfa antibiotics  Family History  Problem Relation Age of Onset  . Hypertension Mother   . Heart attack Mother 77  . Heart disease Unknown     Social History Social History   Tobacco Use  . Smoking status:  Former Smoker    Packs/day: 1.00    Years: 20.00    Pack years: 20.00    Types: Cigarettes    Last attempt to quit: 12/10/1980    Years since quitting: 37.6  . Smokeless tobacco: Never Used  Substance Use Topics  . Alcohol use: No  . Drug use: No    Review of Systems  Constitutional: No fever. ENT: Positive for mild neck pain. Cardiovascular: Denies chest pain. Respiratory: Denies shortness of breath. Gastrointestinal: No vomiting. Genitourinary: Negative for flank pain.  Musculoskeletal: Negative for back pain. Skin: Negative for rash. Neurological: Negative for  headache.   ____________________________________________   PHYSICAL EXAM:  VITAL SIGNS: ED Triage Vitals  Enc Vitals Group     BP 07/11/18 2157 135/78     Pulse Rate 07/11/18 2157 70     Resp 07/11/18 2157 18     Temp 07/11/18 2157 98.1 F (36.7 C)     Temp Source 07/11/18 2157 Oral     SpO2 07/11/18 2157 99 %     Weight 07/11/18 2157 95 lb (43.1 kg)     Height 07/11/18 2157 5\' 2"  (1.575 m)     Head Circumference --      Peak Flow --      Pain Score 07/11/18 2302 0     Pain Loc --      Pain Edu? --      Excl. in GC? --     Constitutional: Alert, well appearing and in no acute distress. Eyes: Conjunctivae are normal.  EOMI.  PERRLA. Head: 2 cm superficial skin tear to right forehead. Nose: No congestion/rhinnorhea. Mouth/Throat: Mucous membranes are moist.   Neck: Normal range of motion.  No midline spinal tenderness. Cardiovascular: Normal rate, regular rhythm. Grossly normal heart sounds.  Good peripheral circulation. Respiratory: Normal respiratory effort.  No retractions. Lungs CTAB. Gastrointestinal: Soft and nontender. No distention.  Genitourinary: No flank tenderness. Musculoskeletal: No lower extremity edema.  Extremities warm and well perfused.  Swelling to lateral left hip with good range of motion.  Normal range of motion to bilateral shoulders, elbows, wrists, knees, and ankles.  2+ distal pulses in all extremities. Neurologic:  Normal speech and language.  Motor intact in all extremities.  Normal coordination.  No gross focal neurologic deficits are appreciated.  Skin:  Skin is warm and dry. No rash noted. Psychiatric:  Speech and behavior are normal.  ____________________________________________   LABS (all labs ordered are listed, but only abnormal results are displayed)  Labs Reviewed - No data to display ____________________________________________  EKG   ____________________________________________  RADIOLOGY  CT head: No ICH CT cervical  spine: No acute fracture XR L hip: No acute fracture  ____________________________________________   PROCEDURES  Procedure(s) performed: No  Procedures  Critical Care performed: No ____________________________________________   INITIAL IMPRESSION / ASSESSMENT AND PLAN / ED COURSE  Pertinent labs & imaging results that were available during my care of the patient were reviewed by me and considered in my medical decision making (see chart for details).  84 year old female with PMH as noted above and frequent prior falls presents with unwitnessed but apparently mechanical fall from standing height with forehead injury but no LOC.  She also has some pain to the left elbow but is moving it normally, and some swelling around the left hip but minimal pain there.  On exam she has good range of motion in all large joints.  Neuro exam is nonfocal.  Given the patient's age we  will obtain CT head and cervical spine as well as x-ray of the left hip.  No indication for lab work-up at this time, since there is no evidence by history or exam of fall precipitated by primary medical cause.  I anticipate discharge home if the imaging is negative.  ----------------------------------------- 11:33 PM on 07/11/2018 -----------------------------------------  CT head, C-spine, and x-ray of the hip are all negative.  The patient is stable for discharge back to her facility. ____________________________________________   FINAL CLINICAL IMPRESSION(S) / ED DIAGNOSES  Final diagnoses:  Minor head injury, initial encounter  Contusion of left elbow, initial encounter  Contusion of left hip, initial encounter      NEW MEDICATIONS STARTED DURING THIS VISIT:  New Prescriptions   No medications on file     Note:  This document was prepared using Dragon voice recognition software and may include unintentional dictation errors.    Dionne BucySiadecki, Tenisha Fleece, MD 07/11/18 [phone]

## 2018-07-11 NOTE — ED Triage Notes (Signed)
Pt presents to ED via [facility] from [facility] c/o unwitnessed fall with head involvement. Pt states she fell while in the bathroom. Hx frequent falls, hx dementia. Skin tears noted to R forehead and L elbow. Pt has multiple bruises from previous falls. ASA 81mg  on MAR, no other blood thinners.

## 2019-11-06 ENCOUNTER — Emergency Department
Admission: EM | Admit: 2019-11-06 | Discharge: 2019-11-06 | Disposition: A | Discharge status: Home / Self Care | Payer: Medicare Other | Attending: Student in an Organized Health Care Education/Training Program | Admitting: Student in an Organized Health Care Education/Training Program

## 2019-11-06 ENCOUNTER — Encounter: Payer: Self-pay | Admitting: *Deleted

## 2019-11-06 ENCOUNTER — Other Ambulatory Visit: Payer: Self-pay

## 2019-11-06 ENCOUNTER — Emergency Department: Payer: Medicare Other

## 2019-11-06 DIAGNOSIS — Y9389 Activity, other specified: Secondary | ICD-10-CM | POA: Insufficient documentation

## 2019-11-06 DIAGNOSIS — W19XXXA Unspecified fall, initial encounter: Secondary | ICD-10-CM | POA: Insufficient documentation

## 2019-11-06 DIAGNOSIS — Z87891 Personal history of nicotine dependence: Secondary | ICD-10-CM | POA: Insufficient documentation

## 2019-11-06 DIAGNOSIS — E039 Hypothyroidism, unspecified: Secondary | ICD-10-CM | POA: Diagnosis not present

## 2019-11-06 DIAGNOSIS — I2510 Atherosclerotic heart disease of native coronary artery without angina pectoris: Secondary | ICD-10-CM | POA: Diagnosis not present

## 2019-11-06 DIAGNOSIS — S0181XA Laceration without foreign body of other part of head, initial encounter: Secondary | ICD-10-CM

## 2019-11-06 DIAGNOSIS — Z79899 Other long term (current) drug therapy: Secondary | ICD-10-CM | POA: Diagnosis not present

## 2019-11-06 DIAGNOSIS — Y92128 Other place in nursing home as the place of occurrence of the external cause: Secondary | ICD-10-CM | POA: Insufficient documentation

## 2019-11-06 DIAGNOSIS — Z7982 Long term (current) use of aspirin: Secondary | ICD-10-CM | POA: Diagnosis not present

## 2019-11-06 DIAGNOSIS — Y998 Other external cause status: Secondary | ICD-10-CM | POA: Diagnosis not present

## 2019-11-06 DIAGNOSIS — S0990XA Unspecified injury of head, initial encounter: Secondary | ICD-10-CM | POA: Diagnosis present

## 2019-11-06 DIAGNOSIS — F0390 Unspecified dementia without behavioral disturbance: Secondary | ICD-10-CM | POA: Diagnosis not present

## 2019-11-06 DIAGNOSIS — T148XXA Other injury of unspecified body region, initial encounter: Secondary | ICD-10-CM

## 2019-11-06 MED ORDER — LIDOCAINE-EPINEPHRINE-TETRACAINE (LET) SOLUTION: 3.0000 mL | Freq: Once | NASAL | Status: DC

## 2019-11-06 NOTE — ED Triage Notes (Signed)
Pt presents after a fall, sustained head laceration, bleeding controlled w/ EMS drsg in place, some blood leaking through.

## 2019-11-06 NOTE — Discharge Instructions (Addendum)
Your exam and CT scans are normal at this time. Your forehead laceration is being repaired with Dermabond. Keep the area clean, dry, and covered. You may apply ice to reduce swelling. Follow-up with the pediatrician as needed.

## 2019-11-06 NOTE — ED Provider Notes (Signed)
[facility] Emergency Department Provider Note ____________________________________________  Time seen: 2019  I have reviewed the triage vital signs and the nursing notes.  HISTORY  Chief Complaint  Head Laceration  HPI Carol Beck is a 86 y.o. female presents to the ED from her residence at [facility], via EMS.  Patient reportedly had a mechanical fall from standing position and fell in her home.  Patient is unclear of the details related to the fall.  But she is otherwise awake, and alert.  She is pleasant and engaged, she is mildly demented, asking repeated questions during the conversation.  She presents with her forehead wrap, and EMS reports a hematoma laceration to the left forehead.  Patient denies any other injuries or complaints at this time.   Past Medical History:  Diagnosis Date  . CAD (coronary artery disease)    Native Vessel  . Hyperlipidemia, mixed   . Hypertension    unspecified  . Hypothyroidism   . Mild dementia ([place])   . Restless leg syndrome   . Vertigo     Patient Active Problem List   Diagnosis Date Noted  . Dizziness 05/13/2013  . Murmur, cardiac 05/13/2013  . CHEST PAIN UNSPECIFIED 05/09/2010  . Carotid stenosis 01/10/2010  . HYPERLIPIDEMIA-MIXED 08/17/2009  . Essential hypertension 08/17/2009  . CAD, NATIVE VESSEL 08/17/2009    Past Surgical History:  Procedure Laterality Date  . EYE SURGERY    . FOOT SURGERY      Prior to Admission medications   Medication Sig Start Date End Date Taking? Authorizing Provider  aspirin 81 MG tablet Take 81 mg by mouth daily.      [provider]  Cholecalciferol (VITAMIN D) 2000 UNITS tablet Take 2,000 Units by mouth daily.    [provider]  donepezil (ARICEPT) 10 MG tablet Take 10 mg by mouth at bedtime.    [provider]  hydrochlorothiazide (HYDRODIURIL) 25 MG tablet Take 25 mg by mouth daily.    [provider]  hydrOXYzine  (ATARAX/VISTARIL) 25 MG tablet Take 25 mg by mouth 3 (three) times daily.    [provider]  ketoconazole (NIZORAL) 2 % shampoo Apply 1 application topically 3 (three) times a week.     [provider]  levothyroxine (SYNTHROID, LEVOTHROID) 75 MCG tablet Take 75 mcg by mouth daily before breakfast.    [provider]  mirtazapine (REMERON) 7.5 MG tablet Take 7.5 mg by mouth at bedtime.    [provider]  ondansetron (ZOFRAN ODT) 4 MG disintegrating tablet Take 1 tablet (4 mg total) by mouth every 8 (eight) hours as needed for nausea or vomiting. 04/01/17   Earleen Newport, MD  potassium chloride SA (K-DUR,KLOR-CON) 20 MEQ tablet Take 20 mEq by mouth daily.    [provider]  pravastatin (PRAVACHOL) 40 MG tablet Take 40 mg by mouth daily.    [provider]  risperiDONE (RISPERDAL) 0.25 MG tablet Take 0.25 mg by mouth daily.    [provider]  senna-docusate (SENOKOT-S) 8.6-50 MG per tablet Take 1 tablet by mouth 2 (two) times daily.     [provider]  venlafaxine XR (EFFEXOR-XR) 75 MG 24 hr capsule Take 75 mg by mouth daily with breakfast.     [provider]    Allergies Evista [raloxifene], Shellfish allergy, and Sulfa antibiotics  Family History  Problem Relation Age of Onset  . Hypertension Mother   . Heart attack Mother 56  . Heart  disease Unknown     Social History Social History   Tobacco Use  . Smoking status: Former Smoker    Packs/day: 1.00    Years: 20.00    Pack years: 20.00    Types: Cigarettes    Quit date: 12/10/1980    Years since quitting: 38.9  . Smokeless tobacco: Never Used  Substance Use Topics  . Alcohol use: No  . Drug use: No    Review of Systems  Constitutional: Negative for fever. Eyes: Negative for visual changes. ENT: Negative for sore throat. Cardiovascular: Negative for chest pain. Respiratory: Negative for shortness of breath. Gastrointestinal: Negative  for abdominal pain, vomiting and diarrhea. Genitourinary: Negative for dysuria. Musculoskeletal: Negative for back pain. Skin: Negative for rash.  Forehead laceration/hematoma as above. Neurological: Negative for headaches, focal weakness or numbness. ____________________________________________  PHYSICAL EXAM:  VITAL SIGNS: ED Triage Vitals  Enc Vitals Group     BP 11/06/19 2010 (!) 156/62     Pulse Rate 11/06/19 2010 77     Resp 11/06/19 2010 20     Temp 11/06/19 2010 98.7 F (37.1 C)     Temp src --      SpO2 11/06/19 2010 97 %     Weight 11/06/19 2012 110 lb (49.9 kg)     Height 11/06/19 2012 5\' 3"  (1.6 m)     Head Circumference --      Peak Flow --      Pain Score 11/06/19 2011 5     Pain Loc --      Pain Edu? --      Excl. in GC? --     Constitutional: Alert and oriented to person, place, and time. Well appearing and in no distress. Head: Normocephalic and atraumatic, except for a large hematoma and superficial skin tear/laceration to the left side of the forehead.  No active bleeding is appreciated.  Patient's face is otherwise covered with dried blood from the injury. Eyes: Conjunctivae are normal. PERRL. Normal extraocular movements Ears: Canals clear. TMs intact bilaterally. Nose: No congestion/rhinorrhea/epistaxis. Mouth/Throat: Mucous membranes are moist. Neck: Supple. Normal ROM. No midline tenderness Cardiovascular: Normal rate, regular rhythm. Normal distal pulses. Respiratory: Normal respiratory effort. No wheezes/rales/rhonchi. Gastrointestinal: Soft and nontender. No distention. Musculoskeletal: Nontender with normal range of motion in all extremities.  Neurologic:  Normal gait without ataxia. Normal speech and language. No gross focal neurologic deficits are appreciated. Skin:  Skin is warm, dry and intact. No rash noted. Psychiatric: Mood and affect are normal. Patient exhibits appropriate insight and  judgment. ____________________________________________   RADIOLOGY  CT Head / Cervical Spine w/o CM  IMPRESSION: 1.  No acute intracranial abnormality. 2. Findings consistent with age related atrophy and chronic small vessel ischemia 3. Prior lacunar infarct in the right basal ganglia 4. Soft tissue swelling and hematoma overlying the right frontal skull 5.  No acute fracture or malalignment of the spine. 6. Grade 1 anterolisthesis of C4 on C5. 7. Cervical spine spondylosis most notable at C4-C5 and C5-C6. ____________________________________________  PROCEDURES  .2012Laceration Repair  Date/Time: 11/06/2019 10:38 PM Performed by: 01/04/2020, PA-C Authorized by: Lissa Hoard, PA-C   Consent:    Consent obtained:  Verbal   Consent given by:  Patient   Risks discussed:  Pain and poor cosmetic result   Alternatives discussed:  No treatment Anesthesia (see MAR for exact dosages):    Anesthesia method:  Topical application   Topical anesthetic:  LET Laceration details:  Location:  Face   Face location:  Forehead   Length (cm):  3   Depth (mm):  3 Repair type:    Repair type:  Simple Pre-procedure details:    Preparation:  Patient was prepped and draped in usual sterile fashion Exploration:    Hemostasis achieved with:  Direct pressure   Contaminated: no   Treatment:    Area cleansed with:  Soap and water Skin repair:    Repair method:  Tissue adhesive Approximation:    Approximation:  Close Post-procedure details:    Dressing:  Bulky dressing   Patient tolerance of procedure:  Tolerated well, no immediate complications   ____________________________________________  INITIAL IMPRESSION / ASSESSMENT AND PLAN / ED COURSE  Geriatric patient with ED evaluation of injury sustained following a mechanical fall.  Patient was presented by EMS with a laceration and hematoma to the left forehead.  There was no reported syncopal or medical issue prior  to the mechanical fall.  CT scans of the head neck were negative for any acute findings.  No further lab testing or imaging was indicated.  Patient has remained active, alert, and stable during her course in the ED.  She will be discharged home and transported via EMS back to her residence.  Spoken with the patient's adult daughter Boneta Lucks) and notified her of the patient's status, CT imaging, and plans for discharge home.  Carol Beck was evaluated in Emergency Department on 11/06/2019 for the symptoms described in the history of present illness. She was evaluated in the context of the global COVID-19 pandemic, which necessitated consideration that the patient might be at risk for infection with the SARS-CoV-2 virus that causes COVID-19. Institutional protocols and algorithms that pertain to the evaluation of patients at risk for COVID-19 are in a state of rapid change based on information released by regulatory bodies including the CDC and federal and state organizations. These policies and algorithms were followed during the patient's care in the ED. ____________________________________________  FINAL CLINICAL IMPRESSION(S) / ED DIAGNOSES  Final diagnoses:  Minor head injury, initial encounter  Laceration of forehead, initial encounter  Hematoma      Karmen Stabs, Charlesetta Ivory, PA-C 11/06/19 2301    Willy Eddy, MD 11/11/19 [phone]

## 2019-11-26 ENCOUNTER — Emergency Department
Admission: EM | Admit: 2019-11-26 | Discharge: 2019-11-26 | Disposition: A | Discharge status: Skilled Nursing Facility | Payer: Medicare Other | Attending: Emergency Medicine | Admitting: Emergency Medicine

## 2019-11-26 ENCOUNTER — Encounter: Payer: Self-pay | Admitting: *Deleted

## 2019-11-26 ENCOUNTER — Other Ambulatory Visit: Payer: Self-pay

## 2019-11-26 DIAGNOSIS — Z4800 Encounter for change or removal of nonsurgical wound dressing: Secondary | ICD-10-CM | POA: Diagnosis not present

## 2019-11-26 DIAGNOSIS — Z5189 Encounter for other specified aftercare: Secondary | ICD-10-CM

## 2019-11-26 NOTE — ED Notes (Signed)
No fall at nursing home.  Wound dressed with gauze.  Pt alert.  No bleeding from wound.

## 2019-11-26 NOTE — ED Provider Notes (Signed)
[facility] Emergency Department Provider Note  ____________________________________________  Time seen: Approximately 8:25 PM  I have reviewed the triage vital signs and the nursing notes.   HISTORY  Chief Complaint Wound Check   HPI Carol Beck is a 86 y.o. female presenting to the emergency department due to persistent bleeding from an old head wound.  Patient picked the scab off and has been bleeding since about 3:00 this afternoon.  She arrived via EMS from [facility].  EMS had applied a compression bandage and the wound is no longer bleeding.  According to EMS, she has had no new injury and no new fall today.  Past Medical History:  Diagnosis Date  . CAD (coronary artery disease)    Native Vessel  . Hyperlipidemia, mixed   . Hypertension    unspecified  . Hypothyroidism   . Mild dementia ([place])   . Restless leg syndrome   . Vertigo     Patient Active Problem List   Diagnosis Date Noted  . Dizziness 05/13/2013  . Murmur, cardiac 05/13/2013  . CHEST PAIN UNSPECIFIED 05/09/2010  . Carotid stenosis 01/10/2010  . HYPERLIPIDEMIA-MIXED 08/17/2009  . Essential hypertension 08/17/2009  . CAD, NATIVE VESSEL 08/17/2009    Past Surgical History:  Procedure Laterality Date  . EYE SURGERY    . FOOT SURGERY      Prior to Admission medications   Medication Sig Start Date End Date Taking? Authorizing Provider  aspirin 81 MG tablet Take 81 mg by mouth daily.      [provider]  Cholecalciferol (VITAMIN D) 2000 UNITS tablet Take 2,000 Units by mouth daily.    [provider]  donepezil (ARICEPT) 10 MG tablet Take 10 mg by mouth at bedtime.    [provider]  hydrochlorothiazide (HYDRODIURIL) 25 MG tablet Take 25 mg by mouth daily.    [provider]  hydrOXYzine (ATARAX/VISTARIL) 25 MG tablet Take 25 mg by mouth 3 (three) times daily.    [provider]  ketoconazole (NIZORAL) 2 % shampoo Apply 1  application topically 3 (three) times a week.     [provider]  levothyroxine (SYNTHROID, LEVOTHROID) 75 MCG tablet Take 75 mcg by mouth daily before breakfast.    [provider]  mirtazapine (REMERON) 7.5 MG tablet Take 7.5 mg by mouth at bedtime.    [provider]  ondansetron (ZOFRAN ODT) 4 MG disintegrating tablet Take 1 tablet (4 mg total) by mouth every 8 (eight) hours as needed for nausea or vomiting. 04/01/17   Earleen Newport, MD  potassium chloride SA (K-DUR,KLOR-CON) 20 MEQ tablet Take 20 mEq by mouth daily.    [provider]  pravastatin (PRAVACHOL) 40 MG tablet Take 40 mg by mouth daily.    [provider]  risperiDONE (RISPERDAL) 0.25 MG tablet Take 0.25 mg by mouth daily.    [provider]  senna-docusate (SENOKOT-S) 8.6-50 MG per tablet Take 1 tablet by mouth 2 (two) times daily.     [provider]  venlafaxine XR (EFFEXOR-XR) 75 MG 24 hr capsule Take 75 mg by mouth daily with breakfast.     [provider]    Allergies Evista [raloxifene], Shellfish allergy, and Sulfa antibiotics  Family History  Problem Relation Age of Onset  . Hypertension Mother   . Heart attack Mother 86  . Heart disease Other     Social History Social History   Tobacco Use  . Smoking status: Former Smoker  Packs/day: 1.00    Years: 20.00    Pack years: 20.00    Types: Cigarettes    Quit date: 12/10/1980    Years since quitting: 38.9  . Smokeless tobacco: Never Used  Substance Use Topics  . Alcohol use: No  . Drug use: No    Review of Systems  Level 5 caveat: Patient has dementia and is confused ____________________________________________   PHYSICAL EXAM:  VITAL SIGNS: ED Triage Vitals [11/26/19 1814]  Enc Vitals Group     BP (!) 138/51     Pulse Rate 70     Resp 20     Temp 98.7 F (37.1 C)     Temp Source Oral     SpO2 100 %     Weight 110 lb (49.9 kg)     Height 5\' 3"  (1.6 m)     Head  Circumference      Peak Flow      Pain Score 0     Pain Loc      Pain Edu?      Excl. in GC?      Constitutional: Overall well appearing. Eyes: Conjunctivae are clear without discharge or drainage. Nose: No rhinorrhea noted. Mouth/Throat: Airway is patent.  Neck: No stridor. Unrestricted range of motion observed. Cardiovascular: Capillary refill is <3 seconds.  Respiratory: Respirations are even and unlabored.. Musculoskeletal: Unrestricted range of motion observed. Neurologic: Awake, alert, and oriented x 4.  Skin: Evidence of superficial wound to the left upper forehead bleeding.  No active bleeding.  ____________________________________________   LABS (all labs ordered are listed, but only abnormal results are displayed)  Labs Reviewed - No data to display ____________________________________________  EKG  Not indicated. ____________________________________________  RADIOLOGY  Not indicated. ____________________________________________   PROCEDURES  Procedures ____________________________________________   INITIAL IMPRESSION / ASSESSMENT AND PLAN / ED COURSE  Carol Beck is a 86 y.o. female was sent to the emergency department due to to persistent bleeding from a scab on her forehead that she scratched off.  After EMS applied the pressure bandage, the bleeding stopped completely.  A small piece of Surgicel was applied over the wound and then a Band-Aid applied over top of that with a light compression stocking type dressing over her head.  If at all possible, it was advised that she keep this in place for the next 24 hours.  EMS kindly waited and was able to transfer her back to [facility] immediately afterward.   Medications - No data to display   Pertinent labs & imaging results that were available during my care of the patient were reviewed by me and considered in my medical decision making (see chart for details).   ____________________________________________   FINAL CLINICAL IMPRESSION(S) / ED DIAGNOSES  Final diagnoses:  Visit for wound check    ED Discharge Orders    None       Note:  This document was prepared using Dragon voice recognition software and may include unintentional dictation errors.   [facility], FNP 11/26/19 2030    11/28/19, MD 11/27/19 0000

## 2019-11-26 NOTE — ED Triage Notes (Signed)
Pt brought in via ems from [facility].  Pt has an old wound on left forehead and pt picked the scab.  Bleeding since 1500 today. No bleeding on arrival  Pt alert, confused.

## 2019-11-26 NOTE — ED Notes (Signed)
fnp at bedside.  Surgicel applied to wound  No bleeding  Pt talkative and alert.

## 2019-11-26 NOTE — Discharge Instructions (Signed)
Leave the bandage on for 24 hours if at all possible then change daily

## 2019-11-27 ENCOUNTER — Other Ambulatory Visit: Payer: Self-pay

## 2019-11-27 ENCOUNTER — Emergency Department: Payer: Medicare Other

## 2019-11-27 ENCOUNTER — Emergency Department
Admission: EM | Admit: 2019-11-27 | Discharge: 2019-11-27 | Disposition: A | Discharge status: Home / Self Care | Payer: Medicare Other | Attending: Emergency Medicine | Admitting: Emergency Medicine

## 2019-11-27 ENCOUNTER — Encounter: Payer: Self-pay | Admitting: Emergency Medicine

## 2019-11-27 DIAGNOSIS — I10 Essential (primary) hypertension: Secondary | ICD-10-CM | POA: Diagnosis not present

## 2019-11-27 DIAGNOSIS — W19XXXA Unspecified fall, initial encounter: Secondary | ICD-10-CM | POA: Insufficient documentation

## 2019-11-27 DIAGNOSIS — Z5189 Encounter for other specified aftercare: Secondary | ICD-10-CM | POA: Diagnosis not present

## 2019-11-27 DIAGNOSIS — I2510 Atherosclerotic heart disease of native coronary artery without angina pectoris: Secondary | ICD-10-CM | POA: Diagnosis not present

## 2019-11-27 DIAGNOSIS — E039 Hypothyroidism, unspecified: Secondary | ICD-10-CM | POA: Insufficient documentation

## 2019-11-27 DIAGNOSIS — Z7982 Long term (current) use of aspirin: Secondary | ICD-10-CM | POA: Diagnosis not present

## 2019-11-27 DIAGNOSIS — Z79899 Other long term (current) drug therapy: Secondary | ICD-10-CM | POA: Insufficient documentation

## 2019-11-27 DIAGNOSIS — S0081XD Abrasion of other part of head, subsequent encounter: Secondary | ICD-10-CM | POA: Insufficient documentation

## 2019-11-27 DIAGNOSIS — F0390 Unspecified dementia without behavioral disturbance: Secondary | ICD-10-CM | POA: Insufficient documentation

## 2019-11-27 DIAGNOSIS — Z87891 Personal history of nicotine dependence: Secondary | ICD-10-CM | POA: Diagnosis not present

## 2019-11-27 LAB — COMPREHENSIVE METABOLIC PANEL
ALT: 12 U/L (ref 0–44)
AST: 21 U/L (ref 15–41)
Albumin: 4 g/dL (ref 3.5–5.0)
Alkaline Phosphatase: 81 U/L (ref 38–126)
Anion gap: 10 (ref 5–15)
BUN: 32 mg/dL — ABNORMAL HIGH (ref 8–23)
CO2: 23 mmol/L (ref 22–32)
Calcium: 9 mg/dL (ref 8.9–10.3)
Chloride: 107 mmol/L (ref 98–111)
Creatinine, Ser: 1.33 mg/dL — ABNORMAL HIGH (ref 0.44–1.00)
GFR calc Af Amer: 41 mL/min — ABNORMAL LOW (ref >60)
GFR calc non Af Amer: 35 mL/min — ABNORMAL LOW (ref >60)
Glucose, Bld: 106 mg/dL — ABNORMAL HIGH (ref 70–99)
Potassium: 4.1 mmol/L (ref 3.5–5.1)
Sodium: 140 mmol/L (ref 135–145)
Total Bilirubin: 0.6 mg/dL (ref 0.3–1.2)
Total Protein: 7.4 g/dL (ref 6.5–8.1)

## 2019-11-27 LAB — CBC WITH DIFFERENTIAL/PLATELET
Abs Immature Granulocytes: 0.09 10*3/uL — ABNORMAL HIGH (ref 0.00–0.07)
Basophils Absolute: 0.1 10*3/uL (ref 0.0–0.1)
Basophils Relative: 1 %
Eosinophils Absolute: 0.2 10*3/uL (ref 0.0–0.5)
Eosinophils Relative: 2 %
HCT: 27.2 % — ABNORMAL LOW (ref 36.0–46.0)
Hemoglobin: 8.4 g/dL — ABNORMAL LOW (ref 12.0–15.0)
Immature Granulocytes: 1 %
Lymphocytes Relative: 14 %
Lymphs Abs: 1.5 10*3/uL (ref 0.7–4.0)
MCH: 28.8 pg (ref 26.0–34.0)
MCHC: 30.9 g/dL (ref 30.0–36.0)
MCV: 93.2 fL (ref 80.0–100.0)
Monocytes Absolute: 0.6 10*3/uL (ref 0.1–1.0)
Monocytes Relative: 6 %
Neutro Abs: 7.9 10*3/uL — ABNORMAL HIGH (ref 1.7–7.7)
Neutrophils Relative %: 76 %
Platelets: 327 10*3/uL (ref 150–400)
RBC: 2.92 MIL/uL — ABNORMAL LOW (ref 3.87–5.11)
RDW: 15.5 % (ref 11.5–15.5)
WBC: 10.4 10*3/uL (ref 4.0–10.5)
nRBC: 0 % (ref 0.0–0.2)

## 2019-11-27 NOTE — ED Notes (Signed)
Called [place] and gave report to [facility]

## 2019-11-27 NOTE — ED Triage Notes (Signed)
Pt to ED via [facility] from [facility]. Pt had a scab on her left forehead that she picked off. Staff reported that pt had significant blood loss, however, no bleeding noted on arrival. Staff also reports that pt had vomiting several times. Per EMS no vomiting in route. Pt is in NAD. Alert but confused.

## 2019-11-27 NOTE — ED Notes (Signed)
Pt unable to sign d/c. Pt informed of waiting on EMS to transport pt back to [facility].

## 2019-11-27 NOTE — Discharge Instructions (Signed)
Your hemoglobin is 8.4 today. Recheck in a week with your doctor   Please do not pick up your scab   See your doctor  Return to ER if you have worse bleeding, vomiting, lethargy

## 2019-11-27 NOTE — ED Provider Notes (Signed)
[facility] EMERGENCY DEPARTMENT Provider Note   CSN: [id number] Arrival date & time: 11/27/19  1046     History Chief Complaint  Patient presents with  . Emesis  . Wound Check    Carol Beck is a 86 y.o. female  Hx of CAD, HL, HTN, dementia here presenting with vomiting, left scalp bleeding. Patient had a fall several weeks ago had a negative CT head.  Patient was seen yesterday for wound check and the wound was healing well.  Patient apparently pick up the scab today and saw some bleeding and was concerned and started vomiting.  Patient is demented and unable to give much history.  History per EMS and nursing home.   The history is provided by the nursing home and the EMS personnel.  Level V caveat- dementia      Past Medical History:  Diagnosis Date  . CAD (coronary artery disease)    Native Vessel  . Hyperlipidemia, mixed   . Hypertension    unspecified  . Hypothyroidism   . Mild dementia (HCC)   . Restless leg syndrome   . Vertigo     Patient Active Problem List   Diagnosis Date Noted  . Dizziness 05/13/2013  . Murmur, cardiac 05/13/2013  . CHEST PAIN UNSPECIFIED 05/09/2010  . Carotid stenosis 01/10/2010  . HYPERLIPIDEMIA-MIXED 08/17/2009  . Essential hypertension 08/17/2009  . CAD, NATIVE VESSEL 08/17/2009    Past Surgical History:  Procedure Laterality Date  . EYE SURGERY    . FOOT SURGERY       OB History   No obstetric history on file.     Family History  Problem Relation Age of Onset  . Hypertension Mother   . Heart attack Mother 24  . Heart disease Other     Social History   Tobacco Use  . Smoking status: Former Smoker    Packs/day: 1.00    Years: 20.00    Pack years: 20.00    Types: Cigarettes    Quit date: 12/10/1980    Years since quitting: 38.9  . Smokeless tobacco: Never Used  Substance Use Topics  . Alcohol use: No  . Drug use: No    Home Medications Prior to Admission medications   Medication  Sig Start Date End Date Taking? Authorizing Provider  aspirin 81 MG tablet Take 81 mg by mouth daily.      [provider]  Cholecalciferol (VITAMIN D) 2000 UNITS tablet Take 2,000 Units by mouth daily.    [provider]  donepezil (ARICEPT) 10 MG tablet Take 10 mg by mouth at bedtime.    [provider]  hydrochlorothiazide (HYDRODIURIL) 25 MG tablet Take 25 mg by mouth daily.    [provider]  hydrOXYzine (ATARAX/VISTARIL) 25 MG tablet Take 25 mg by mouth 3 (three) times daily.    [provider]  ketoconazole (NIZORAL) 2 % shampoo Apply 1 application topically 3 (three) times a week.     [provider]  levothyroxine (SYNTHROID, LEVOTHROID) 75 MCG tablet Take 75 mcg by mouth daily before breakfast.    [provider]  mirtazapine (REMERON) 7.5 MG tablet Take 7.5 mg by mouth at bedtime.    [provider]  ondansetron (ZOFRAN ODT) 4 MG disintegrating tablet Take 1 tablet (4 mg total) by mouth every 8 (eight) hours as needed for nausea or vomiting. 04/01/17   [name], MD  potassium chloride SA (K-DUR,KLOR-CON) 20 MEQ tablet Take 20 mEq by  mouth daily.    [provider]  pravastatin (PRAVACHOL) 40 MG tablet Take 40 mg by mouth daily.    [provider]  risperiDONE (RISPERDAL) 0.25 MG tablet Take 0.25 mg by mouth daily.    [provider]  senna-docusate (SENOKOT-S) 8.6-50 MG per tablet Take 1 tablet by mouth 2 (two) times daily.     [provider]  venlafaxine XR (EFFEXOR-XR) 75 MG 24 hr capsule Take 75 mg by mouth daily with breakfast.     [provider]    Allergies    Evista [raloxifene], Shellfish allergy, and Sulfa antibiotics  Review of Systems   Review of Systems  Gastrointestinal: Positive for vomiting.  All other systems reviewed and are negative.   Physical Exam Updated Vital Signs BP (!) 103/44 (BP Location: Left Arm)   Pulse 67   Temp 97.8  F (36.6 C) (Oral)   Resp 16   Ht 5\' 3"  (1.6 m)   Wt 50 kg   SpO2 95%   BMI 19.51 kg/m   Physical Exam Vitals and nursing note reviewed.  HENT:     Head: Normocephalic.     Comments: L forehead abrasion that is healing well, no active bleeding     Mouth/Throat:     Mouth: Mucous membranes are moist.  Eyes:     Extraocular Movements: Extraocular movements intact.     Pupils: Pupils are equal, round, and reactive to light.  Cardiovascular:     Rate and Rhythm: Normal rate and regular rhythm.     Pulses: Normal pulses.     Heart sounds: Normal heart sounds.  Pulmonary:     Effort: Pulmonary effort is normal.     Breath sounds: Normal breath sounds.  Abdominal:     General: Abdomen is flat.  Musculoskeletal:        General: Normal range of motion.     Cervical back: Normal range of motion.  Skin:    General: Skin is warm.     Capillary Refill: Capillary refill takes less than 2 seconds.  Neurological:     Mental Status: She is alert and oriented to person, place, and time.  Psychiatric:        Mood and Affect: Mood normal.        Behavior: Behavior normal.     ED Results / Procedures / Treatments   Labs (all labs ordered are listed, but only abnormal results are displayed) Labs Reviewed  CBC WITH DIFFERENTIAL/PLATELET - Abnormal; Notable for the following components:      Result Value   RBC 2.92 (*)    Hemoglobin 8.4 (*)    HCT 27.2 (*)    Neutro Abs 7.9 (*)    Abs Immature Granulocytes 0.09 (*)    All other components within normal limits  COMPREHENSIVE METABOLIC PANEL - Abnormal; Notable for the following components:   Glucose, Bld 106 (*)    BUN 32 (*)    Creatinine, Ser 1.33 (*)    GFR calc non Af Amer 35 (*)    GFR calc Af Amer 41 (*)    All other components within normal limits  CBG MONITORING, ED    EKG None  Radiology No results found.  Procedures Procedures (including critical care time)  Medications Ordered in ED Medications - No data to  display  ED Course  I have reviewed the triage vital signs and the nursing notes.  Pertinent labs & imaging results that were available during my care  of the patient were reviewed by me and considered in my medical decision making (see chart for details).    MDM Rules/Calculators/A&P                      MARQUE BANGO is a 86 y.o. female who presented with persistent bleeding from scalp wound and vomiting.  She is demented unable to give much history and I don't know if she had another fall.  Nonfocal neuro exam currently.  Given that she has a return visit, we will get a CT head and basic lab work.  12:59 PM  Hg is 8.4, was 10 several years ago. CT head unremarkable. Stable for discharge   Final Clinical Impression(s) / ED Diagnoses Final diagnoses:  None    Rx / DC Orders ED Discharge Orders    None       Drenda Freeze, MD 11/27/19 1300

## 2020-06-29 ENCOUNTER — Other Ambulatory Visit: Payer: Self-pay

## 2020-06-29 ENCOUNTER — Emergency Department
Admission: EM | Admit: 2020-06-29 | Discharge: 2020-06-29 | Disposition: A | Discharge status: Home / Self Care | Payer: Medicare Other | Attending: Emergency Medicine | Admitting: Emergency Medicine

## 2020-06-29 ENCOUNTER — Emergency Department: Payer: Medicare Other

## 2020-06-29 DIAGNOSIS — W19XXXA Unspecified fall, initial encounter: Secondary | ICD-10-CM | POA: Insufficient documentation

## 2020-06-29 DIAGNOSIS — Z7982 Long term (current) use of aspirin: Secondary | ICD-10-CM | POA: Insufficient documentation

## 2020-06-29 DIAGNOSIS — Z87891 Personal history of nicotine dependence: Secondary | ICD-10-CM | POA: Diagnosis not present

## 2020-06-29 DIAGNOSIS — E039 Hypothyroidism, unspecified: Secondary | ICD-10-CM | POA: Insufficient documentation

## 2020-06-29 DIAGNOSIS — I2510 Atherosclerotic heart disease of native coronary artery without angina pectoris: Secondary | ICD-10-CM | POA: Diagnosis not present

## 2020-06-29 DIAGNOSIS — M25512 Pain in left shoulder: Secondary | ICD-10-CM | POA: Insufficient documentation

## 2020-06-29 DIAGNOSIS — M546 Pain in thoracic spine: Secondary | ICD-10-CM | POA: Insufficient documentation

## 2020-06-29 DIAGNOSIS — I10 Essential (primary) hypertension: Secondary | ICD-10-CM | POA: Insufficient documentation

## 2020-06-29 DIAGNOSIS — Y9289 Other specified places as the place of occurrence of the external cause: Secondary | ICD-10-CM | POA: Insufficient documentation

## 2020-06-29 DIAGNOSIS — Y939 Activity, unspecified: Secondary | ICD-10-CM | POA: Diagnosis not present

## 2020-06-29 DIAGNOSIS — S22010A Wedge compression fracture of first thoracic vertebra, initial encounter for closed fracture: Secondary | ICD-10-CM | POA: Diagnosis not present

## 2020-06-29 DIAGNOSIS — Y999 Unspecified external cause status: Secondary | ICD-10-CM | POA: Diagnosis not present

## 2020-06-29 DIAGNOSIS — F0390 Unspecified dementia without behavioral disturbance: Secondary | ICD-10-CM | POA: Insufficient documentation

## 2020-06-29 LAB — CBC
HCT: 34.2 % — ABNORMAL LOW (ref 36.0–46.0)
Hemoglobin: 11 g/dL — ABNORMAL LOW (ref 12.0–15.0)
MCH: 28.3 pg (ref 26.0–34.0)
MCHC: 32.2 g/dL (ref 30.0–36.0)
MCV: 87.9 fL (ref 80.0–100.0)
Platelets: 303 10*3/uL (ref 150–400)
RBC: 3.89 MIL/uL (ref 3.87–5.11)
RDW: 16.2 % — ABNORMAL HIGH (ref 11.5–15.5)
WBC: 14.1 10*3/uL — ABNORMAL HIGH (ref 4.0–10.5)
nRBC: 0 % (ref 0.0–0.2)

## 2020-06-29 LAB — BASIC METABOLIC PANEL
Anion gap: 11 (ref 5–15)
BUN: 25 mg/dL — ABNORMAL HIGH (ref 8–23)
CO2: 23 mmol/L (ref 22–32)
Calcium: 9.4 mg/dL (ref 8.9–10.3)
Chloride: 102 mmol/L (ref 98–111)
Creatinine, Ser: 0.97 mg/dL (ref 0.44–1.00)
GFR calc Af Amer: 60 mL/min — ABNORMAL LOW (ref >60)
GFR calc non Af Amer: 51 mL/min — ABNORMAL LOW (ref >60)
Glucose, Bld: 116 mg/dL — ABNORMAL HIGH (ref 70–99)
Potassium: 4 mmol/L (ref 3.5–5.1)
Sodium: 136 mmol/L (ref 135–145)

## 2020-06-29 LAB — TROPONIN I (HIGH SENSITIVITY): Troponin I (High Sensitivity): 7 ng/L (ref <18)

## 2020-06-29 LAB — PROTIME-INR
INR: 1 (ref 0.8–1.2)
Prothrombin Time: 12.9 seconds (ref 11.4–15.2)

## 2020-06-29 MED ORDER — ONDANSETRON 4 MG PO TBDP
4.0000 mg | ORAL_TABLET | Freq: Once | ORAL | Status: AC
  Administered 2020-06-29: 4 mg via ORAL
  Filled 2020-06-29: qty 1

## 2020-06-29 MED ORDER — TRAMADOL HCL 50 MG PO TABS: 50.0000 mg | ORAL_TABLET | Freq: Four times a day (QID) | ORAL | 0 refills | Status: DC | PRN

## 2020-06-29 NOTE — Discharge Instructions (Signed)
Carol Beck has a small compression fracture of her t1 vertebrate. This typically heals well on its own and no special precautions need to be taken. She should take tylenol for pain and tramadol as needed

## 2020-06-29 NOTE — ED Notes (Signed)
Called report to [facility].  Called and spoke with patient's daughter.  Discussed ED findings and discharge instructions.  States that no family is available to transport patient back to facility.  Facility states family 'usually' picks patient up from hospital.

## 2020-06-29 NOTE — ED Triage Notes (Signed)
Pt in via EMS from [facility] with c/o fall this am. Pt c/p CP, left shoulder pain, back pain and neck pain with movement

## 2020-06-29 NOTE — ED Triage Notes (Signed)
See first nurse note. Pt fell at nursing home this morning. Hx  of dementia.

## 2020-06-29 NOTE — ED Notes (Signed)
Pt had brief episode of nausea. EDP at bedside to evaluate. Pt has no complaints but moans when touched.

## 2020-06-29 NOTE — ED Provider Notes (Addendum)
[facility] Emergency Department Provider Note   ____________________________________________    I have reviewed the triage vital signs and the nursing notes.   HISTORY  Chief Complaint Fall  History limited by dementia   HPI Carol Beck is a 86 y.o. female who presents from [facility] who apparently had a fall this morning, unclear if this was witnessed or not.  Patient complained of central chest discomfort after the fall, left shoulder pain and some upper back pain.  Has been ambulating without difficulty.  Patient denies abdominal pain, no vomiting.  Describes some pain with range of motion of the left shoulder although she is able to do so quite well.  Describes lower neck/upper back pain.  No pain with range of motion of the lower extremities   Past Medical History:  Diagnosis Date  . CAD (coronary artery disease)    Native Vessel  . Hyperlipidemia, mixed   . Hypertension    unspecified  . Hypothyroidism   . Mild dementia (HCC)   . Restless leg syndrome   . Vertigo     Patient Active Problem List   Diagnosis Date Noted  . Dizziness 05/13/2013  . Murmur, cardiac 05/13/2013  . CHEST PAIN UNSPECIFIED 05/09/2010  . Carotid stenosis 01/10/2010  . HYPERLIPIDEMIA-MIXED 08/17/2009  . Essential hypertension 08/17/2009  . CAD, NATIVE VESSEL 08/17/2009    Past Surgical History:  Procedure Laterality Date  . EYE SURGERY    . FOOT SURGERY      Prior to Admission medications   Medication Sig Start Date End Date Taking? Authorizing Provider  aspirin 81 MG tablet Take 81 mg by mouth daily.      [provider]  Cholecalciferol (VITAMIN D) 2000 UNITS tablet Take 2,000 Units by mouth daily.    [provider]  donepezil (ARICEPT) 10 MG tablet Take 10 mg by mouth at bedtime.    [provider]  hydrochlorothiazide (HYDRODIURIL) 25 MG tablet Take 25 mg by mouth daily.    [provider]  hydrOXYzine  (ATARAX/VISTARIL) 25 MG tablet Take 25 mg by mouth 3 (three) times daily.    [provider]  ketoconazole (NIZORAL) 2 % shampoo Apply 1 application topically 3 (three) times a week.     [provider]  levothyroxine (SYNTHROID, LEVOTHROID) 75 MCG tablet Take 75 mcg by mouth daily before breakfast.    [provider]  mirtazapine (REMERON) 7.5 MG tablet Take 7.5 mg by mouth at bedtime.    [provider]  ondansetron (ZOFRAN ODT) 4 MG disintegrating tablet Take 1 tablet (4 mg total) by mouth every 8 (eight) hours as needed for nausea or vomiting. 04/01/17   [name], MD  potassium chloride SA (K-DUR,KLOR-CON) 20 MEQ tablet Take 20 mEq by mouth daily.    [provider]  pravastatin (PRAVACHOL) 40 MG tablet Take 40 mg by mouth daily.    [provider]  risperiDONE (RISPERDAL) 0.25 MG tablet Take 0.25 mg by mouth daily.    [provider]  senna-docusate (SENOKOT-S) 8.6-50 MG per tablet Take 1 tablet by mouth 2 (two) times daily.     [provider]  traMADol (ULTRAM) 50 MG tablet Take 1 tablet (50 mg total) by mouth every 6 (six) hours as needed. 06/29/20 06/29/21  Jene Every, MD  venlafaxine XR (EFFEXOR-XR) 75 MG 24 hr capsule Take 75 mg by mouth daily with breakfast.     [provider]  Allergies Evista [raloxifene], Shellfish allergy, and Sulfa antibiotics  Family History  Problem Relation Age of Onset  . Hypertension Mother   . Heart attack Mother 66  . Heart disease Other     Social History Social History   Tobacco Use  . Smoking status: Former Smoker    Packs/day: 1.00    Years: 20.00    Pack years: 20.00    Types: Cigarettes    Quit date: 12/10/1980    Years since quitting: 39.5  . Smokeless tobacco: Never Used  Substance Use Topics  . Alcohol use: No  . Drug use: No    Review of Systems  Constitutional: Denies dizziness Eyes: No visual changes.  ENT: No nasal  injury Cardiovascular: Denies chest pain. Respiratory: Denies shortness of breath. Gastrointestinal: Denies abdominal pain Genitourinary: Negative for dysuria. Musculoskeletal: Upper back pain as above Skin: No laceration Neurological: Negative for headaches or weakness   ____________________________________________   PHYSICAL EXAM:  VITAL SIGNS: ED Triage Vitals  Enc Vitals Group     BP 06/29/20 1124 (!) 123/58     Pulse Rate 06/29/20 1124 79     Resp 06/29/20 1124 18     Temp 06/29/20 1124 99.6 F (37.6 C)     Temp Source 06/29/20 1124 Oral     SpO2 06/29/20 1124 94 %     Weight 06/29/20 1120 50 kg (110 lb 3.7 oz)     Height 06/29/20 1120 1.6 m (5\' 3" )     Head Circumference --      Peak Flow --      Pain Score 06/29/20 1120 7     Pain Loc --      Pain Edu? --      Excl. in GC? --     Constitutional: Alert and oriented. No acute distress.  Eyes: Conjunctivae are normal.  Head: Atraumatic. Nose: No congestion/rhinnorhea. Mouth/Throat: Mucous membranes are moist.   Neck:  Painless ROM, no pain with axial load Cardiovascular: Normal rate, regular rhythm. Grossly normal heart sounds.  Good peripheral circulation. Respiratory: Normal respiratory effort.  No retractions. Lungs CTAB. Gastrointestinal: Soft and nontender. No distention.  No CVA tenderness. Genitourinary: deferred Musculoskeletal: Full range of motion of all extremities.  Has some discomfort with full abduction of left shoulder but no bony abnormalities or suggestion of fracture.  Has point tenderness over the vertebral spine at around C7, otherwise no vertebral tenderness palpation.  Normal strength in the lower extremities.  No pain with axial load on both hips.  Mild chest wall tenderness along the sternum bilaterally, no bruising Neurologic:  Normal speech and language. No gross focal neurologic deficits are appreciated.  Skin:  Skin is warm, dry and intact. No rash noted. Psychiatric: Mood and affect are  normal. Speech and behavior are normal.  ____________________________________________   LABS (all labs ordered are listed, but only abnormal results are displayed)  Labs Reviewed  BASIC METABOLIC PANEL - Abnormal; Notable for the following components:      Result Value   Glucose, Bld 116 (*)    BUN 25 (*)    GFR calc non Af Amer 51 (*)    GFR calc Af Amer 60 (*)    All other components within normal limits  CBC - Abnormal; Notable for the following components:   WBC 14.1 (*)    Hemoglobin 11.0 (*)    HCT 34.2 (*)    RDW 16.2 (*)    All other components within normal limits  PROTIME-INR  TROPONIN I (HIGH SENSITIVITY)  TROPONIN I (HIGH SENSITIVITY)   ____________________________________________  EKG  ED ECG REPORT I, Jene Every, the attending physician, personally viewed and interpreted this ECG.  Date: 06/29/2020  Rhythm: normal sinus rhythm QRS Axis: normal Intervals: normal ST/T Wave abnormalities: normal Narrative Interpretation: no evidence of acute ischemia  ____________________________________________  RADIOLOGY  CT cervical spine, CT head ____________________________________________   PROCEDURES  Procedure(s) performed: No  Procedures   Critical Care performed: No ____________________________________________   INITIAL IMPRESSION / ASSESSMENT AND PLAN / ED COURSE  Pertinent labs & imaging results that were available during my care of the patient were reviewed by me and considered in my medical decision making (see chart for details).  Patient presents after a fall as described above.  Exam is most consistent with shoulder sprain to the left arm, does have some tenderness to palpation of the lower neck, CT cervical spine demonstrates possible T1 compression fracture.  CT head is reassuring.  Chest x-ray is unremarkable, normal sternum.  Recommend treatment with Tylenol, tramadol as needed, outpatient follow-up with orthopedics for compression  fracture  ----------------------------------------- 4:44 PM on 06/29/2020 -----------------------------------------  Patient complains of mild nausea, reexamined, no abdominal tenderness to palpation, remains well-appearing with normal vitals.  ODT Zofran ordered.    ____________________________________________   FINAL CLINICAL IMPRESSION(S) / ED DIAGNOSES  Final diagnoses:  Fall, initial encounter  Compression fracture of T1 vertebra, initial encounter [facility])        Note:  This document was prepared using Dragon voice recognition software and may include unintentional dictation errors.   Jene Every, MD 06/29/20 1638    Jene Every, MD 06/29/20 [phone]

## 2020-07-01 ENCOUNTER — Other Ambulatory Visit: Payer: Self-pay

## 2020-07-01 ENCOUNTER — Emergency Department: Payer: Medicare Other

## 2020-07-01 ENCOUNTER — Emergency Department
Admission: EM | Admit: 2020-07-01 | Discharge: 2020-07-01 | Disposition: A | Discharge status: Home / Self Care | Payer: Medicare Other | Attending: Emergency Medicine | Admitting: Emergency Medicine

## 2020-07-01 DIAGNOSIS — M545 Low back pain: Secondary | ICD-10-CM | POA: Diagnosis not present

## 2020-07-01 DIAGNOSIS — Z8781 Personal history of (healed) traumatic fracture: Secondary | ICD-10-CM | POA: Insufficient documentation

## 2020-07-01 DIAGNOSIS — Z87891 Personal history of nicotine dependence: Secondary | ICD-10-CM | POA: Diagnosis not present

## 2020-07-01 DIAGNOSIS — E039 Hypothyroidism, unspecified: Secondary | ICD-10-CM | POA: Diagnosis not present

## 2020-07-01 DIAGNOSIS — Y92002 Bathroom of unspecified non-institutional (private) residence single-family (private) house as the place of occurrence of the external cause: Secondary | ICD-10-CM | POA: Insufficient documentation

## 2020-07-01 DIAGNOSIS — Y998 Other external cause status: Secondary | ICD-10-CM | POA: Diagnosis not present

## 2020-07-01 DIAGNOSIS — I2510 Atherosclerotic heart disease of native coronary artery without angina pectoris: Secondary | ICD-10-CM | POA: Diagnosis not present

## 2020-07-01 DIAGNOSIS — W182XXA Fall in (into) shower or empty bathtub, initial encounter: Secondary | ICD-10-CM | POA: Insufficient documentation

## 2020-07-01 DIAGNOSIS — Y9389 Activity, other specified: Secondary | ICD-10-CM | POA: Insufficient documentation

## 2020-07-01 DIAGNOSIS — I10 Essential (primary) hypertension: Secondary | ICD-10-CM | POA: Diagnosis not present

## 2020-07-01 DIAGNOSIS — Z7982 Long term (current) use of aspirin: Secondary | ICD-10-CM | POA: Diagnosis not present

## 2020-07-01 DIAGNOSIS — R52 Pain, unspecified: Secondary | ICD-10-CM

## 2020-07-01 DIAGNOSIS — Z79899 Other long term (current) drug therapy: Secondary | ICD-10-CM | POA: Insufficient documentation

## 2020-07-01 DIAGNOSIS — W19XXXA Unspecified fall, initial encounter: Secondary | ICD-10-CM

## 2020-07-01 LAB — URINALYSIS, COMPLETE (UACMP) WITH MICROSCOPIC
Bacteria, UA: NONE SEEN
Bilirubin Urine: NEGATIVE
Glucose, UA: NEGATIVE mg/dL
Hgb urine dipstick: NEGATIVE
Ketones, ur: NEGATIVE mg/dL
Leukocytes,Ua: NEGATIVE
Nitrite: NEGATIVE
Protein, ur: 30 mg/dL — AB
Specific Gravity, Urine: 1.02 (ref 1.005–1.030)
pH: 5 (ref 5.0–8.0)

## 2020-07-01 LAB — CBC WITH DIFFERENTIAL/PLATELET
Abs Immature Granulocytes: 0.06 10*3/uL (ref 0.00–0.07)
Basophils Absolute: 0.1 10*3/uL (ref 0.0–0.1)
Basophils Relative: 1 %
Eosinophils Absolute: 0.2 10*3/uL (ref 0.0–0.5)
Eosinophils Relative: 1 %
HCT: 35.6 % — ABNORMAL LOW (ref 36.0–46.0)
Hemoglobin: 11.3 g/dL — ABNORMAL LOW (ref 12.0–15.0)
Immature Granulocytes: 1 %
Lymphocytes Relative: 16 %
Lymphs Abs: 1.9 10*3/uL (ref 0.7–4.0)
MCH: 28.2 pg (ref 26.0–34.0)
MCHC: 31.7 g/dL (ref 30.0–36.0)
MCV: 88.8 fL (ref 80.0–100.0)
Monocytes Absolute: 1.1 10*3/uL — ABNORMAL HIGH (ref 0.1–1.0)
Monocytes Relative: 10 %
Neutro Abs: 8.5 10*3/uL — ABNORMAL HIGH (ref 1.7–7.7)
Neutrophils Relative %: 71 %
Platelets: 303 10*3/uL (ref 150–400)
RBC: 4.01 MIL/uL (ref 3.87–5.11)
RDW: 16.4 % — ABNORMAL HIGH (ref 11.5–15.5)
WBC: 11.9 10*3/uL — ABNORMAL HIGH (ref 4.0–10.5)
nRBC: 0 % (ref 0.0–0.2)

## 2020-07-01 LAB — TROPONIN I (HIGH SENSITIVITY)
Troponin I (High Sensitivity): 6 ng/L (ref <18)
Troponin I (High Sensitivity): 8 ng/L (ref <18)

## 2020-07-01 LAB — COMPREHENSIVE METABOLIC PANEL
ALT: 19 U/L (ref 0–44)
AST: 34 U/L (ref 15–41)
Albumin: 4.1 g/dL (ref 3.5–5.0)
Alkaline Phosphatase: 79 U/L (ref 38–126)
Anion gap: 12 (ref 5–15)
BUN: 27 mg/dL — ABNORMAL HIGH (ref 8–23)
CO2: 23 mmol/L (ref 22–32)
Calcium: 9.3 mg/dL (ref 8.9–10.3)
Chloride: 101 mmol/L (ref 98–111)
Creatinine, Ser: 1.18 mg/dL — ABNORMAL HIGH (ref 0.44–1.00)
GFR calc Af Amer: 47 mL/min — ABNORMAL LOW (ref >60)
GFR calc non Af Amer: 41 mL/min — ABNORMAL LOW (ref >60)
Glucose, Bld: 102 mg/dL — ABNORMAL HIGH (ref 70–99)
Potassium: 4.2 mmol/L (ref 3.5–5.1)
Sodium: 136 mmol/L (ref 135–145)
Total Bilirubin: 0.8 mg/dL (ref 0.3–1.2)
Total Protein: 8 g/dL (ref 6.5–8.1)

## 2020-07-01 MED ORDER — TRAMADOL HCL 50 MG PO TABS
50.0000 mg | ORAL_TABLET | Freq: Once | ORAL | Status: AC
  Administered 2020-07-01: 50 mg via ORAL
  Filled 2020-07-01: qty 1

## 2020-07-01 MED ORDER — LIDOCAINE 5 % EX PTCH
1.0000 | MEDICATED_PATCH | CUTANEOUS | Status: DC
  Administered 2020-07-01: 1 via TRANSDERMAL
  Filled 2020-07-01: qty 1

## 2020-07-01 MED ORDER — LIDOCAINE 5 % EX PTCH: 1.0000 | MEDICATED_PATCH | Freq: Two times a day (BID) | CUTANEOUS | 0 refills | Status: AC

## 2020-07-01 NOTE — ED Provider Notes (Signed)
EKG viewed and interpreted by myself shows a normal sinus rhythm at 64 bpm with a narrow QRS, left axis deviation, largely normal intervals, no concerning ST changes noted.    Minna Antis, MD 07/01/20 1945

## 2020-07-01 NOTE — Discharge Instructions (Addendum)
Follow-up with Dr. Rosita Kea who is the orthopedist that she referred to on your initial visit to the emergency department for your compression fracture if you continue to have pain.  You may take the hydrocodone every 6-8 hours to help control pain.  The tramadol that was written for you was on a as needed basis but not to be taken with the hydrocodone.  .  A prescription for Lidoderm patches was sent to [facility] on [address]  These are every 12 hours and should help control the pain.

## 2020-07-01 NOTE — ED Triage Notes (Signed)
Pt here via ems from [facility], pt fell Wednesday and was diagnosed with a T1 compression fx, pt then fell again Thursday and since has been having pain all over.

## 2020-07-01 NOTE — ED Triage Notes (Signed)
Pt in via EMS from [facility] with c/o Chest Wall pain. Pt reports pain with movement. Pt has taken tramadol with no relief, pt also took hydrocodone as well and appears to have some relief. EMS reports pt not wincing as much. Pt daughter in route.

## 2020-07-01 NOTE — ED Notes (Signed)
Pt in xray

## 2020-07-01 NOTE — ED Notes (Signed)
See triage note presents vis EMS from [facility]  S/p fall  States she fell again and having some pain in chest wall  Was also seen for compression fx this week

## 2020-07-01 NOTE — ED Provider Notes (Signed)
[facility] Emergency Department Provider Note   ____________________________________________   First MD Initiated Contact with Patient 07/01/20 1213     (approximate)  I have reviewed the triage vital signs and the nursing notes.   HISTORY  Chief Complaint Fall   HPI Carol Beck is a 86 y.o. female presents to the ED via EMS from [facility].  Patient had a fall and was seen 2 days ago in the ED where she was diagnosed with a small compression fracture of T1.  Patient reportedly fell again yesterday and since has been complaining about pain "all over".  Patient denies any head injury or LOC.  She reports that her back hurts in the same area and also has pain in her left chest area.  There is been no history of nausea, vomiting or difficulty breathing.  Daughter states that patient took tramadol and then took hydrocodone prior to arrival in the ED.  Daughter reports that she does have a walker but when going to the bathroom apparently forgets to take it with her.        Past Medical History:  Diagnosis Date  . CAD (coronary artery disease)    Native Vessel  . Hyperlipidemia, mixed   . Hypertension    unspecified  . Hypothyroidism   . Mild dementia (HCC)   . Restless leg syndrome   . Vertigo     Patient Active Problem List   Diagnosis Date Noted  . Dizziness 05/13/2013  . Murmur, cardiac 05/13/2013  . CHEST PAIN UNSPECIFIED 05/09/2010  . Carotid stenosis 01/10/2010  . HYPERLIPIDEMIA-MIXED 08/17/2009  . Essential hypertension 08/17/2009  . CAD, NATIVE VESSEL 08/17/2009    Past Surgical History:  Procedure Laterality Date  . EYE SURGERY    . FOOT SURGERY      Prior to Admission medications   Medication Sig Start Date End Date Taking? Authorizing Provider  aspirin 81 MG tablet Take 81 mg by mouth daily.      [provider]  Cholecalciferol (VITAMIN D) 2000 UNITS tablet Take 2,000 Units by mouth daily.    [provider]  donepezil (ARICEPT) 10 MG tablet Take 10 mg by mouth at bedtime.    [provider]  hydrochlorothiazide (HYDRODIURIL) 25 MG tablet Take 25 mg by mouth daily.    [provider]  hydrOXYzine (ATARAX/VISTARIL) 25 MG tablet Take 25 mg by mouth 3 (three) times daily.    [provider]  ketoconazole (NIZORAL) 2 % shampoo Apply 1 application topically 3 (three) times a week.     [provider]  levothyroxine (SYNTHROID, LEVOTHROID) 75 MCG tablet Take 75 mcg by mouth daily before breakfast.    [provider]  lidocaine (LIDODERM) 5 % Place 1 patch onto the skin every 12 (twelve) hours. Remove & Discard patch within 12 hours or as directed by MD 07/01/20 07/01/21  Bridget Hartshorn L, PA-C  mirtazapine (REMERON) 7.5 MG tablet Take 7.5 mg by mouth at bedtime.    [provider]  ondansetron (ZOFRAN ODT) 4 MG disintegrating tablet Take 1 tablet (4 mg total) by mouth every 8 (eight) hours as needed for nausea or vomiting. 04/01/17   [name], MD  potassium chloride SA (K-DUR,KLOR-CON) 20 MEQ tablet Take 20 mEq by mouth daily.    [provider]  pravastatin (PRAVACHOL) 40 MG tablet Take 40 mg by mouth daily.    [provider]  risperiDONE (RISPERDAL) 0.25 MG tablet Take 0.25  mg by mouth daily.    [provider]  senna-docusate (SENOKOT-S) 8.6-50 MG per tablet Take 1 tablet by mouth 2 (two) times daily.     [provider]  traMADol (ULTRAM) 50 MG tablet Take 1 tablet (50 mg total) by mouth every 6 (six) hours as needed. 06/29/20 06/29/21  Jene Every, MD  venlafaxine XR (EFFEXOR-XR) 75 MG 24 hr capsule Take 75 mg by mouth daily with breakfast.     [provider]    Allergies Evista [raloxifene], Shellfish allergy, and Sulfa antibiotics  Family History  Problem Relation Age of Onset  . Hypertension Mother   . Heart attack Mother 61  . Heart disease Other     Social History Social History     Tobacco Use  . Smoking status: Former Smoker    Packs/day: 1.00    Years: 20.00    Pack years: 20.00    Types: Cigarettes    Quit date: 12/10/1980    Years since quitting: 39.5  . Smokeless tobacco: Never Used  Substance Use Topics  . Alcohol use: No  . Drug use: No    Review of Systems Constitutional: No fever/chills Eyes: No visual changes. ENT: No sore throat. Cardiovascular: Denies chest pain. Respiratory: Denies shortness of breath.  Negative for cough. Gastrointestinal: No abdominal pain.  No nausea, no vomiting.  No diarrhea.  No constipation. Genitourinary: Negative for dysuria. Musculoskeletal: Positive for back pain.  Positive for left-sided anterior chest pain. Skin: Negative for rash. Neurological: Negative for headaches, focal weakness or numbness.  ____________________________________________   PHYSICAL EXAM:  VITAL SIGNS: ED Triage Vitals  Enc Vitals Group     BP 07/01/20 1150 (!) 142/56     Pulse Rate 07/01/20 1150 66     Resp 07/01/20 1150 16     Temp 07/01/20 1150 98 F (36.7 C)     Temp Source 07/01/20 1150 Oral     SpO2 07/01/20 1150 95 %     Weight 07/01/20 1152 110 lb 3.7 oz (50 kg)     Height 07/01/20 1152 5\' 3"  (1.6 m)     Head Circumference --      Peak Flow --      Pain Score --      Pain Loc --      Pain Edu? --      Excl. in GC? --     Constitutional: Alert and oriented. Well appearing and in no acute distress.  Patient is talkative and answers questions appropriately.  She is very soft-spoken.  Daughter is with the patient at the time of exam. Eyes: Conjunctivae are normal. PERRL. EOMI. Head: Atraumatic. Nose: No trauma. Neck: No stridor.  Nontender cervical spine to palpation posteriorly. Cardiovascular: Normal rate, regular rhythm. Grossly normal heart sounds.  Good peripheral circulation. Respiratory: Normal respiratory effort.  No retractions. Lungs CTAB. Gastrointestinal: Soft and nontender. No distention.   Musculoskeletal: There continues to be some tenderness on palpation of the thoracic spine and the area of approximately T1-T2 and T2-T3.  No evidence of new injury noted.  Patient is tender on palpation of the left lateral and anterior chest wall.  No bruising noted. Neurologic:  Normal speech and language. No gross focal neurologic deficits are appreciated.  Skin:  Skin is warm, dry and intact. No rash noted. Psychiatric: Mood and affect are normal. Speech and behavior are normal.  ____________________________________________   LABS (all labs ordered are listed, but only abnormal results are displayed)  Labs Reviewed  URINALYSIS, COMPLETE (UACMP) WITH MICROSCOPIC - Abnormal; Notable for the following components:      Result Value   Color, Urine YELLOW (*)    APPearance HAZY (*)    Protein, ur 30 (*)    All other components within normal limits  CBC WITH DIFFERENTIAL/PLATELET - Abnormal; Notable for the following components:   WBC 11.9 (*)    Hemoglobin 11.3 (*)    HCT 35.6 (*)    RDW 16.4 (*)    Neutro Abs 8.5 (*)    Monocytes Absolute 1.1 (*)    All other components within normal limits  COMPREHENSIVE METABOLIC PANEL - Abnormal; Notable for the following components:   Glucose, Bld 102 (*)    BUN 27 (*)    Creatinine, Ser 1.18 (*)    GFR calc non Af Amer 41 (*)    GFR calc Af Amer 47 (*)    All other components within normal limits  TROPONIN I (HIGH SENSITIVITY)  TROPONIN I (HIGH SENSITIVITY)   ____________________________________________  EKG  Reviewed by doctor in [facility] ED Normal sinus rhythm with ventricular rate of 64. ____________________________________________  RADIOLOGY  Official radiology report(s): DG Chest 2 View  Result Date: 07/01/2020 CLINICAL DATA:  Fall EXAM: CHEST - 2 VIEW COMPARISON:  06/29/2020 FINDINGS: The heart size and mediastinal contours are within normal limits. Atherosclerotic calcification of the aortic knob. No focal airspace  consolidation, pleural effusion, or pneumothorax. No new or acute osseous findings. IMPRESSION: No active cardiopulmonary disease. Electronically Signed   By: Duanne Guess D.O.   On: 07/01/2020 13:21   DG Thoracic Spine 2 View  Result Date: 07/01/2020 CLINICAL DATA:  History of recent compression fracture of T1.  Fall. EXAM: THORACIC SPINE 2 VIEWS COMPARISON:  06/29/2020 FINDINGS: T1 vertebral body is not well evaluated on lateral view secondary to overlapping structures on lateral and swimmer's view. No evidence of progressive height loss on frontal view. Bones appear demineralized. There is no evidence of new thoracic spine fracture. Alignment is normal. No other significant bone abnormalities are identified. IMPRESSION: No evidence of new or worsening compression fracture within the thoracic spine. Electronically Signed   By: Duanne Guess D.O.   On: 07/01/2020 13:20    ____________________________________________   PROCEDURES  Procedure(s) performed (including Critical Care):  Procedures   ____________________________________________   INITIAL IMPRESSION / ASSESSMENT AND PLAN / ED COURSE  As part of my medical decision making, I reviewed the following data within the electronic MEDICAL RECORD NUMBER Notes from prior ED visits and [place] Controlled Substance Database  LENELL MCCONNELL was evaluated in Emergency Department on 07/01/2020 for the symptoms described in the history of present illness. She was evaluated in the context of the global COVID-19 pandemic, which necessitated consideration that the patient might be at risk for infection with the SARS-CoV-2 virus that causes COVID-19. Institutional protocols and algorithms that pertain to the evaluation of patients at risk for COVID-19 are in a state of rapid change based on information released by regulatory bodies including the CDC and federal and state organizations. These policies and algorithms were followed during the patient's care in  the ED.  86 year old female is brought to the ED via EMS from [facility] with complaint of back pain.  Patient was seen 2 days ago which time she was diagnosed with a T1 compression fracture and placed on tramadol.  Daughter states that this was not strong enough and that her PCP called a prescription in for hydrocodone that patient took.  Daughter states that she did fall again yesterday and x-rays were taken to evaluate if there is a reinjury or new compression fractures.  No changes were noted.  Urinalysis was negative for infection, troponin negative, chest x-ray without active cardiopulmonary disease.  EKG was evaluated by doctor in [facility].  A Lidoderm patch was applied to pain area.  Discussed pain medication with daughter that the pain medication could cause drowsiness and increase her risk for falling again and increased risk for hip fractures.  Patient was discharged to continue with the 2 medications she already has using the hydrocodone as prescribed by her PCP and using the tramadol for breakthrough pain only if needed.  A prescription for Lidoderm patches was sent to the pharmacy.  Daughter is to make an appointment with Dr. Rosita KeaMenz who was the orthopedist on-call on her initial visit to the ED.  [facility]. ____________________________________________   FINAL CLINICAL IMPRESSION(S) / ED DIAGNOSES  Final diagnoses:  Encounter for pain management  History of compression fracture of spine  Fall, initial encounter     ED Discharge Orders         Ordered    lidocaine (LIDODERM) 5 %  Every 12 hours        07/01/20 1545           Note:  This document was prepared using Dragon voice recognition software and may include unintentional dictation errors.    Tommi RumpsSummers, Simmie Camerer L, PA-C 07/01/20 1630    Gilles ChiquitoSmith, Zachary P, MD 07/01/20 1758

## 2020-07-01 NOTE — ED Notes (Signed)
Pt given water and ginger ale. Instructed to notify when going to toilet for UA sample.

## 2020-12-22 ENCOUNTER — Other Ambulatory Visit: Payer: Self-pay

## 2020-12-22 ENCOUNTER — Emergency Department
Admission: EM | Admit: 2020-12-22 | Discharge: 2020-12-22 | Disposition: A | Discharge status: Home / Self Care | Payer: Medicare Other | Attending: Emergency Medicine | Admitting: Emergency Medicine

## 2020-12-22 DIAGNOSIS — Z79899 Other long term (current) drug therapy: Secondary | ICD-10-CM | POA: Insufficient documentation

## 2020-12-22 DIAGNOSIS — S51012A Laceration without foreign body of left elbow, initial encounter: Secondary | ICD-10-CM | POA: Insufficient documentation

## 2020-12-22 DIAGNOSIS — F0390 Unspecified dementia without behavioral disturbance: Secondary | ICD-10-CM | POA: Insufficient documentation

## 2020-12-22 DIAGNOSIS — E039 Hypothyroidism, unspecified: Secondary | ICD-10-CM | POA: Diagnosis not present

## 2020-12-22 DIAGNOSIS — Z7982 Long term (current) use of aspirin: Secondary | ICD-10-CM | POA: Diagnosis not present

## 2020-12-22 DIAGNOSIS — Z87891 Personal history of nicotine dependence: Secondary | ICD-10-CM | POA: Insufficient documentation

## 2020-12-22 DIAGNOSIS — I2510 Atherosclerotic heart disease of native coronary artery without angina pectoris: Secondary | ICD-10-CM | POA: Diagnosis not present

## 2020-12-22 DIAGNOSIS — I10 Essential (primary) hypertension: Secondary | ICD-10-CM | POA: Insufficient documentation

## 2020-12-22 DIAGNOSIS — X58XXXA Exposure to other specified factors, initial encounter: Secondary | ICD-10-CM | POA: Insufficient documentation

## 2020-12-22 DIAGNOSIS — Y92129 Unspecified place in nursing home as the place of occurrence of the external cause: Secondary | ICD-10-CM | POA: Diagnosis not present

## 2020-12-22 DIAGNOSIS — S51812A Laceration without foreign body of left forearm, initial encounter: Secondary | ICD-10-CM | POA: Diagnosis present

## 2020-12-22 NOTE — ED Triage Notes (Signed)
Pt brought in from [facility] for skin tear to left arm for 48 hrs. Denies any fall, staff did not mention how pt got skin tear. Pt has been touching and reopening wound. Small amt of bleeding per EMS, pt has hx of dementia, states normal mentation at this time.

## 2020-12-22 NOTE — ED Provider Notes (Addendum)
[facility] Emergency Department Provider Note  ____________________________________________  Time seen: Approximately 10:03 PM  I have reviewed the triage vital signs and the nursing notes.   HISTORY  Chief Complaint Abrasion    HPI Carol Beck is a 87 y.o. female who presents the emergency department from assisted living for complaint of skin tear.  Patient sustained a skin tear to her left arm approximately 2 days ago.  There was no reported trauma to the area.  According to staff patient keeps "messing" with the area.  Staff reports that as the patient touches the area it continues to bleed.  There is no ongoing bleeding.  Patient has been moving the extremity is appropriate.  Patient was sent here for evaluation of skin tear.         Past Medical History:  Diagnosis Date  . CAD (coronary artery disease)    Native Vessel  . Hyperlipidemia, mixed   . Hypertension    unspecified  . Hypothyroidism   . Mild dementia (HCC)   . Restless leg syndrome   . Vertigo     Patient Active Problem List   Diagnosis Date Noted  . Dizziness 05/13/2013  . Murmur, cardiac 05/13/2013  . CHEST PAIN UNSPECIFIED 05/09/2010  . Carotid stenosis 01/10/2010  . HYPERLIPIDEMIA-MIXED 08/17/2009  . Essential hypertension 08/17/2009  . CAD, NATIVE VESSEL 08/17/2009    Past Surgical History:  Procedure Laterality Date  . EYE SURGERY    . FOOT SURGERY      Prior to Admission medications   Medication Sig Start Date End Date Taking? Authorizing Provider  aspirin 81 MG tablet Take 81 mg by mouth daily.      [provider]  Cholecalciferol (VITAMIN D) 2000 UNITS tablet Take 2,000 Units by mouth daily.    [provider]  donepezil (ARICEPT) 10 MG tablet Take 10 mg by mouth at bedtime.    [provider]  hydrochlorothiazide (HYDRODIURIL) 25 MG tablet Take 25 mg by mouth daily.    [provider]  hydrOXYzine (ATARAX/VISTARIL) 25 MG  tablet Take 25 mg by mouth 3 (three) times daily.    [provider]  ketoconazole (NIZORAL) 2 % shampoo Apply 1 application topically 3 (three) times a week.     [provider]  levothyroxine (SYNTHROID, LEVOTHROID) 75 MCG tablet Take 75 mcg by mouth daily before breakfast.    [provider]  lidocaine (LIDODERM) 5 % Place 1 patch onto the skin every 12 (twelve) hours. Remove & Discard patch within 12 hours or as directed by MD 07/01/20 07/01/21  Bridget Hartshorn L, PA-C  mirtazapine (REMERON) 7.5 MG tablet Take 7.5 mg by mouth at bedtime.    [provider]  ondansetron (ZOFRAN ODT) 4 MG disintegrating tablet Take 1 tablet (4 mg total) by mouth every 8 (eight) hours as needed for nausea or vomiting. 04/01/17   [name], MD  potassium chloride SA (K-DUR,KLOR-CON) 20 MEQ tablet Take 20 mEq by mouth daily.    [provider]  pravastatin (PRAVACHOL) 40 MG tablet Take 40 mg by mouth daily.    [provider]  risperiDONE (RISPERDAL) 0.25 MG tablet Take 0.25 mg by mouth daily.    [provider]  senna-docusate (SENOKOT-S) 8.6-50 MG per tablet Take 1 tablet by mouth 2 (two) times daily.     [provider]  traMADol (ULTRAM) 50 MG tablet Take 1 tablet (50 mg total) by mouth every 6 (six) hours as  needed. 06/29/20 06/29/21  Jene Every, MD  venlafaxine XR (EFFEXOR-XR) 75 MG 24 hr capsule Take 75 mg by mouth daily with breakfast.     [provider]    Allergies Evista [raloxifene], Shellfish allergy, and Sulfa antibiotics  Family History  Problem Relation Age of Onset  . Hypertension Mother   . Heart attack Mother 45  . Heart disease Other     Social History Social History   Tobacco Use  . Smoking status: Former Smoker    Packs/day: 1.00    Years: 20.00    Pack years: 20.00    Types: Cigarettes    Quit date: 12/10/1980    Years since quitting: 40.0  . Smokeless tobacco: Never Used  Substance Use  Topics  . Alcohol use: No  . Drug use: No     Review of Systems  Constitutional: No fever/chills Eyes: No visual changes. No discharge ENT: No upper respiratory complaints. Cardiovascular: no chest pain. Respiratory: no cough. No SOB. Gastrointestinal: No abdominal pain.  No nausea, no vomiting.  No diarrhea.  No constipation. Musculoskeletal: Negative for musculoskeletal pain. Skin: Positive for skin tear to the left forearm Neurological: Negative for headaches, focal weakness or numbness.  10 System ROS otherwise negative.  ____________________________________________   PHYSICAL EXAM:  VITAL SIGNS: ED Triage Vitals [12/22/20 2149]  Enc Vitals Group     BP (!) 148/86     Pulse Rate 75     Resp 20     Temp 98.7 F (37.1 C)     Temp Source Oral     SpO2 99 %     Weight 125 lb (56.7 kg)     Height      Head Circumference      Peak Flow      Pain Score 0     Pain Loc      Pain Edu?      Excl. in GC?      Constitutional: Alert and oriented. Well appearing and in no acute distress. Eyes: Conjunctivae are normal. PERRL. EOMI. Head: Atraumatic. ENT:      Ears:       Nose: No congestion/rhinnorhea.      Mouth/Throat: Mucous membranes are moist.  Neck: No stridor.    Cardiovascular: Normal rate, regular rhythm. Normal S1 and S2.  Good peripheral circulation. Respiratory: Normal respiratory effort without tachypnea or retractions. Lungs CTAB. Good air entry to the bases with no decreased or absent breath sounds. Musculoskeletal: Full range of motion to all extremities. No gross deformities appreciated. Neurologic:  Normal speech and language. No gross focal neurologic deficits are appreciated.  Skin:  Skin is warm, dry and intact. No rash noted.  Superficial small skin tear noted to the left forearm.  After removal of dressing, there is slight amount of bleeding.  No pulsatile bleeding.  This is easily controlled with direct pressure.  There is no surrounding erythema  or edema concerning for cellulitis.  Area is redressed in the emergency department Psychiatric: Mood and affect are normal. Speech and behavior are normal. Patient exhibits appropriate insight and judgement.   ____________________________________________   LABS (all labs ordered are listed, but only abnormal results are displayed)  Labs Reviewed - No data to display ____________________________________________  EKG   ____________________________________________  RADIOLOGY   No results found.  ____________________________________________    PROCEDURES  Procedure(s) performed:    Procedures    Medications - No data to display   ____________________________________________   INITIAL IMPRESSION /  ASSESSMENT AND PLAN / ED COURSE  Pertinent labs & imaging results that were available during my care of the patient were reviewed by me and considered in my medical decision making (see chart for details).  Review of the  [place] was performed in accordance of the [facility] prior to dispensing any controlled drugs.           Patient's diagnosis is consistent with skin tear.  Patient presented to emergency department with an skin tear that has been present x2 days.  She presents from assisted living where the staff states that the patient keeps "messing" with the area.  They report that area starts to bleed after the patient touches the area.  Visualization reveals a very small area of skin tear with some mild bleeding after dressing is removed.  This is nonpulsatile.  Easily controlled with direct pressure.  There is no surrounding erythema or edema concerning for cellulitis.  Skin tear occurred 48 hours ago and there is no indication for current closure.  Again very superficial, as long as area remains bandaged, low risk for any complications.  Patient does have dementia, but is aware of this area, denies interfering with this site.  This area is redressed in the emergency  department.  No indication for additional work-up at this time. Patient is given ED precautions to return to the ED for any worsening or new symptoms.   Addendum: While waiting for EMS transport, patient had some bleedthrough on her dressing.  I reevaluated the patient in a triage room.  There was a small amount of nonpulsatile bleeding at this time.  Area was injected using lidocaine with epi with minimal bleeding.  Surgicel dressing applied over top with gauze and Coban.  Patient had no bleedthrough at this time.  I have provided instructions for the facility not to change the dressing for 2 days and then reevaluate.  For any bleedthrough patient may return to the emergency department.  ____________________________________________  FINAL CLINICAL IMPRESSION(S) / ED DIAGNOSES  Final diagnoses:  Skin tear of left elbow without complication, initial encounter      NEW MEDICATIONS STARTED DURING THIS VISIT:  ED Discharge Orders    None          This chart was dictated using voice recognition software/Dragon. Despite best efforts to proofread, errors can occur which can change the meaning. Any change was purely unintentional.    Racheal Patches, PA-C 12/22/20 2219    Cuthriell, Delorise Royals, PA-C 12/22/20 2340    Shaune Pollack, MD 12/26/20 2124

## 2021-03-11 ENCOUNTER — Emergency Department: Payer: Medicare Other

## 2021-03-11 ENCOUNTER — Other Ambulatory Visit: Payer: Self-pay

## 2021-03-11 ENCOUNTER — Encounter: Payer: Self-pay | Admitting: Emergency Medicine

## 2021-03-11 ENCOUNTER — Emergency Department
Admission: EM | Admit: 2021-03-11 | Discharge: 2021-03-11 | Disposition: A | Discharge status: Home / Self Care | Payer: Medicare Other | Attending: Emergency Medicine | Admitting: Emergency Medicine

## 2021-03-11 DIAGNOSIS — Z7982 Long term (current) use of aspirin: Secondary | ICD-10-CM | POA: Insufficient documentation

## 2021-03-11 DIAGNOSIS — W01198A Fall on same level from slipping, tripping and stumbling with subsequent striking against other object, initial encounter: Secondary | ICD-10-CM | POA: Insufficient documentation

## 2021-03-11 DIAGNOSIS — I119 Hypertensive heart disease without heart failure: Secondary | ICD-10-CM | POA: Diagnosis not present

## 2021-03-11 DIAGNOSIS — Z79899 Other long term (current) drug therapy: Secondary | ICD-10-CM | POA: Insufficient documentation

## 2021-03-11 DIAGNOSIS — E039 Hypothyroidism, unspecified: Secondary | ICD-10-CM | POA: Insufficient documentation

## 2021-03-11 DIAGNOSIS — S0181XA Laceration without foreign body of other part of head, initial encounter: Secondary | ICD-10-CM | POA: Diagnosis not present

## 2021-03-11 DIAGNOSIS — I2510 Atherosclerotic heart disease of native coronary artery without angina pectoris: Secondary | ICD-10-CM | POA: Diagnosis not present

## 2021-03-11 DIAGNOSIS — Z87891 Personal history of nicotine dependence: Secondary | ICD-10-CM | POA: Insufficient documentation

## 2021-03-11 DIAGNOSIS — F0390 Unspecified dementia without behavioral disturbance: Secondary | ICD-10-CM | POA: Diagnosis not present

## 2021-03-11 DIAGNOSIS — S0990XA Unspecified injury of head, initial encounter: Secondary | ICD-10-CM | POA: Diagnosis present

## 2021-03-11 DIAGNOSIS — Y92129 Unspecified place in nursing home as the place of occurrence of the external cause: Secondary | ICD-10-CM | POA: Insufficient documentation

## 2021-03-11 DIAGNOSIS — W19XXXA Unspecified fall, initial encounter: Secondary | ICD-10-CM

## 2021-03-11 MED ORDER — LIDOCAINE-EPINEPHRINE 2 %-1:100000 IJ SOLN
30.0000 mL | Freq: Once | INTRAMUSCULAR | Status: AC
  Administered 2021-03-11: 30 mL via INTRADERMAL
  Filled 2021-03-11: qty 2

## 2021-03-11 MED ORDER — ALPRAZOLAM 0.25 MG PO TABS
0.2500 mg | ORAL_TABLET | Freq: Once | ORAL | Status: AC
  Administered 2021-03-11: 0.25 mg via ORAL
  Filled 2021-03-11: qty 1

## 2021-03-11 MED ORDER — LIDOCAINE-EPINEPHRINE-TETRACAINE (LET) TOPICAL GEL
3.0000 mL | Freq: Once | TOPICAL | Status: AC
  Administered 2021-03-11: 3 mL via TOPICAL
  Filled 2021-03-11: qty 3

## 2021-03-11 MED ORDER — ACETAMINOPHEN 325 MG PO TABS
650.0000 mg | ORAL_TABLET | Freq: Once | ORAL | Status: AC
  Administered 2021-03-11: 650 mg via ORAL
  Filled 2021-03-11: qty 2

## 2021-03-11 NOTE — ED Provider Notes (Signed)
[facility] Emergency Department Provider Note  ____________________________________________   Event Date/Time   First MD Initiated Contact with Patient 03/11/21 1136     (approximate)  I have reviewed the triage vital signs and the nursing notes.   HISTORY  Chief Complaint Fall    HPI Carol Beck is a 87 y.o. female [facility].  Fall was unwitnessed.  Patient has dementia at baseline.  Her daughter is in the exam room and states she has not heard how she fell or what happened.  Patient states that she tripped over her feet.  Patient has large laceration to the right side of her forehead.  Review of systems is difficult as patient is poor historian    Past Medical History:  Diagnosis Date  . CAD (coronary artery disease)    Native Vessel  . Hyperlipidemia, mixed   . Hypertension    unspecified  . Hypothyroidism   . Mild dementia (HCC)   . Restless leg syndrome   . Vertigo     Patient Active Problem List   Diagnosis Date Noted  . Dizziness 05/13/2013  . Murmur, cardiac 05/13/2013  . CHEST PAIN UNSPECIFIED 05/09/2010  . Carotid stenosis 01/10/2010  . HYPERLIPIDEMIA-MIXED 08/17/2009  . Essential hypertension 08/17/2009  . CAD, NATIVE VESSEL 08/17/2009    Past Surgical History:  Procedure Laterality Date  . EYE SURGERY    . FOOT SURGERY      Prior to Admission medications   Medication Sig Start Date End Date Taking? Authorizing Provider  aspirin 81 MG tablet Take 81 mg by mouth daily.      [provider]  Cholecalciferol (VITAMIN D) 2000 UNITS tablet Take 2,000 Units by mouth daily.    [provider]  donepezil (ARICEPT) 10 MG tablet Take 10 mg by mouth at bedtime.    [provider]  hydrochlorothiazide (HYDRODIURIL) 25 MG tablet Take 25 mg by mouth daily.    [provider]  hydrOXYzine (ATARAX/VISTARIL) 25 MG tablet Take 25 mg by mouth 3  (three) times daily.    [provider]  ketoconazole (NIZORAL) 2 % shampoo Apply 1 application topically 3 (three) times a week.     [provider]  levothyroxine (SYNTHROID, LEVOTHROID) 75 MCG tablet Take 75 mcg by mouth daily before breakfast.    [provider]  lidocaine (LIDODERM) 5 % Place 1 patch onto the skin every 12 (twelve) hours. Remove & Discard patch within 12 hours or as directed by MD 07/01/20 07/01/21  Bridget HartshornSummers, Rhonda L, PA-C  mirtazapine (REMERON) 7.5 MG tablet Take 7.5 mg by mouth at bedtime.    [provider]  ondansetron (ZOFRAN ODT) 4 MG disintegrating tablet Take 1 tablet (4 mg total) by mouth every 8 (eight) hours as needed for nausea or vomiting. 04/01/17   Emily FilbertWilliams, Jonathan E, MD  potassium chloride SA (K-DUR,KLOR-CON) 20 MEQ tablet Take 20 mEq by mouth daily.    [provider]  pravastatin (PRAVACHOL) 40 MG tablet Take 40 mg by mouth daily.    [provider]  risperiDONE (RISPERDAL) 0.25 MG tablet Take 0.25 mg by mouth daily.    [provider]  senna-docusate (SENOKOT-S) 8.6-50 MG per tablet Take 1 tablet by mouth 2 (two) times daily.     [provider]  traMADol (ULTRAM) 50 MG tablet Take 1 tablet (50 mg total) by mouth every 6 (six) hours as needed. 06/29/20 06/29/21  Cyril LoosenKinner,  Molly Maduro, MD  venlafaxine XR (EFFEXOR-XR) 75 MG 24 hr capsule Take 75 mg by mouth daily with breakfast.     [provider]    Allergies Evista [raloxifene], Shellfish allergy, and Sulfa antibiotics  Family History  Problem Relation Age of Onset  . Hypertension Mother   . Heart attack Mother 17  . Heart disease Other     Social History Social History   Tobacco Use  . Smoking status: Former Smoker    Packs/day: 1.00    Years: 20.00    Pack years: 20.00    Types: Cigarettes    Quit date: 12/10/1980    Years since quitting: 40.2  . Smokeless tobacco: Never Used  Substance Use Topics  . Alcohol use: No  .  Drug use: No    Review of Systems Patient is poor historian Constitutional: No fever/chills Eyes: No visual changes. ENT: No sore throat. Respiratory: Denies cough Cardiovascular: Denies chest pain Gastrointestinal: Denies abdominal pain Genitourinary: Negative for dysuria. Musculoskeletal: Negative for back pain. Skin: Negative for rash. Psychiatric: no mood changes,     ____________________________________________   PHYSICAL EXAM:  VITAL SIGNS: ED Triage Vitals [03/11/21 1039]  Enc Vitals Group     BP 134/60     Pulse Rate 70     Resp 18     Temp 98.4 F (36.9 C)     Temp Source Oral     SpO2 94 %     Weight 121 lb 4.1 oz (55 kg)     Height 5\' 3"  (1.6 m)     Head Circumference      Peak Flow      Pain Score      Pain Loc      Pain Edu?      Excl. in GC?     Constitutional: Alert and oriented. Well appearing and in no acute distress. Eyes: Conjunctivae are normal.  Head: Positive laceration to the forehead Nose: No congestion/rhinnorhea. Mouth/Throat: Mucous membranes are moist.   Neck:  supple no lymphadenopathy noted Cardiovascular: Normal rate, regular rhythm. Heart sounds are normal Respiratory: Normal respiratory effort.  No retractions, lungs c t a  Abd: soft nontender bs normal all 4 quad GU: deferred Musculoskeletal: Left hip is tender, left clavicle is tender, neurovascular is intact neurologic:  Normal speech and language.  Skin:  Skin is warm, dry, positive laceration no rash noted. Psychiatric: Mood and affect are normal. Speech and behavior are normal.  ____________________________________________   LABS (all labs ordered are listed, but only abnormal results are displayed)  Labs Reviewed - No data to display ____________________________________________   ____________________________________________  RADIOLOGY  CT the head Chest x-ray/x-ray of the left  hip  ____________________________________________   PROCEDURES  Procedure(s) performed:   Marland KitchenLaceration Repair  Date/Time: 03/11/2021 4:38 PM Performed by: 03/13/2021, PA-C Authorized by: Faythe Ghee, PA-C   Consent:    Consent obtained:  Verbal   Consent given by:  Patient   Risks, benefits, and alternatives were discussed: yes     Risks discussed:  Infection, pain, poor cosmetic result and poor wound healing Universal protocol:    Procedure explained and questions answered to patient or proxy's satisfaction: yes     Patient identity confirmed:  Verbally with patient Anesthesia:    Anesthesia method:  Topical application and local infiltration   Topical anesthetic:  LET   Local anesthetic:  Lidocaine 1% WITH epi Laceration details:    Location:  Face   Face  location:  Forehead   Length (cm):  8 Pre-procedure details:    Preparation:  Patient was prepped and draped in usual sterile fashion Exploration:    Hemostasis achieved with:  LET   Imaging outcome: foreign body not noted     Wound extent: no foreign bodies/material noted, no muscle damage noted and no underlying fracture noted   Treatment:    Area cleansed with:  Povidone-iodine and saline   Amount of cleaning:  Standard   Irrigation solution:  Sterile saline   Irrigation method:  Tap   Debridement:  None   Undermining:  None Skin repair:    Repair method:  Sutures and Steri-Strips   Suture size:  5-0   Suture technique:  Simple interrupted   Number of sutures:  4   Number of Steri-Strips:  5 Approximation:    Approximation:  Close Repair type:    Repair type:  Simple Post-procedure details:    Dressing:  Open (no dressing)   Procedure completion:  Tolerated well, no immediate complications .Marland KitchenLaceration Repair  Date/Time: 03/11/2021 4:40 PM Performed by: Faythe Ghee, PA-C Authorized by: Faythe Ghee, PA-C   Consent:    Consent obtained:  Verbal   Consent given by:  Patient   Risks,  benefits, and alternatives were discussed: yes     Risks discussed:  Infection, pain, poor wound healing and poor cosmetic result Universal protocol:    Procedure explained and questions answered to patient or proxy's satisfaction: yes     Patient identity confirmed:  Verbally with patient Anesthesia:    Anesthesia method:  Topical application   Topical anesthetic:  LET Laceration details:    Location:  Face   Face location:  Forehead   Length (cm):  2 Exploration:    Hemostasis achieved with:  LET   Imaging outcome: foreign body not noted     Wound extent: no foreign bodies/material noted, no muscle damage noted and no underlying fracture noted     Contaminated: no   Treatment:    Area cleansed with:  Saline   Amount of cleaning:  Standard   Irrigation solution:  Sterile saline   Irrigation method:  Tap   Debridement:  None Skin repair:    Repair method:  Tissue adhesive Approximation:    Approximation:  Close Repair type:    Repair type:  Simple Post-procedure details:    Dressing:  Open (no dressing)   Procedure completion:  Tolerated well, no immediate complications      ____________________________________________   INITIAL IMPRESSION / ASSESSMENT AND PLAN / ED COURSE  Pertinent labs & imaging results that were available during my care of the patient were reviewed by me and considered in my medical decision making (see chart for details).   Patient is a 87 year old female presents emergency department after a fall.  See HPI.  Physical exam shows patient were stable at this time  Patient's baseline is dementia.  Family member states that she is HOH.  Has not heard from the facility on how she fell.  DDx: SAH, subdural, skull fracture, left hip fracture, rib fracture  Ct head is negative for any acute abnormality  Pt's daughter is in room and requesting cxr , also pt c/o hip pain  Xray of left hip and chest reviewed by me confirmed by radiology to be  negative  Explained all of the findings to the daughter and patient, while suturing the patient, the patient said "Ow" a few times, the daughter instructed  me to stop and I applied steri strips to the remainder of the laceration.  dermabond to the other wound,   Laceration care instructions were discussed, sutures should be removed in 7 days, return if worsening, tdap utd per daughter, pt was discharged in stable condition     Carol Beck was evaluated in Emergency Department on 03/11/2021 for the symptoms described in the history of present illness. She was evaluated in the context of the global COVID-19 pandemic, which necessitated consideration that the patient might be at risk for infection with the SARS-CoV-2 virus that causes COVID-19. Institutional protocols and algorithms that pertain to the evaluation of patients at risk for COVID-19 are in a state of rapid change based on information released by regulatory bodies including the CDC and federal and state organizations. These policies and algorithms were followed during the patient's care in the ED.    As part of my medical decision making, I reviewed the following data within the electronic MEDICAL RECORD NUMBER History obtained from family, Nursing notes reviewed and incorporated, Old chart reviewed, Radiograph reviewed , Notes from prior ED visits and [place] Controlled Substance Database  ____________________________________________   FINAL CLINICAL IMPRESSION(S) / ED DIAGNOSES  Final diagnoses:  Fall, initial encounter  Injury of head, initial encounter  Forehead laceration, initial encounter      NEW MEDICATIONS STARTED DURING THIS VISIT:  Discharge Medication List as of 03/11/2021  2:12 PM       Note:  This document was prepared using Dragon voice recognition software and may include unintentional dictation errors.    Faythe Ghee, PA-C 03/11/21 1645    Shaune Pollack, MD 03/16/21 1721

## 2021-03-11 NOTE — Discharge Instructions (Addendum)
Follow-up with your regular doctor for suture removal in 1 week. Keep the area as clean and dry as possible. Do not apply any moist type bandages on the area as this will cause the Steri-Strips and the Dermabond to peel. Return if any sign of infection or see your regular doctor  I would advise that the patient be in a wheelchair instead of using a walker to prevent additional falls.

## 2021-03-11 NOTE — ED Triage Notes (Signed)
PT to ER via EMS from [facility] after taking a fall.  Pt has dementia at baseline and is unsure how she fell and does not remember falling.  Per EMS pt hit head on a metal stand.  Pt with large laceration to right side of forehead, bleeding controled, dressing applied.

## 2021-03-31 ENCOUNTER — Emergency Department
Admission: EM | Admit: 2021-03-31 | Discharge: 2021-03-31 | Disposition: A | Discharge status: Home / Self Care | Payer: Medicare Other | Attending: Emergency Medicine | Admitting: Emergency Medicine

## 2021-03-31 ENCOUNTER — Emergency Department: Payer: Medicare Other

## 2021-03-31 ENCOUNTER — Other Ambulatory Visit: Payer: Self-pay

## 2021-03-31 DIAGNOSIS — Z87891 Personal history of nicotine dependence: Secondary | ICD-10-CM | POA: Insufficient documentation

## 2021-03-31 DIAGNOSIS — I2510 Atherosclerotic heart disease of native coronary artery without angina pectoris: Secondary | ICD-10-CM | POA: Insufficient documentation

## 2021-03-31 DIAGNOSIS — W06XXXA Fall from bed, initial encounter: Secondary | ICD-10-CM | POA: Insufficient documentation

## 2021-03-31 DIAGNOSIS — F0390 Unspecified dementia without behavioral disturbance: Secondary | ICD-10-CM | POA: Insufficient documentation

## 2021-03-31 DIAGNOSIS — S22050A Wedge compression fracture of T5-T6 vertebra, initial encounter for closed fracture: Secondary | ICD-10-CM | POA: Diagnosis not present

## 2021-03-31 DIAGNOSIS — E039 Hypothyroidism, unspecified: Secondary | ICD-10-CM | POA: Diagnosis not present

## 2021-03-31 DIAGNOSIS — I10 Essential (primary) hypertension: Secondary | ICD-10-CM | POA: Diagnosis not present

## 2021-03-31 DIAGNOSIS — Z7982 Long term (current) use of aspirin: Secondary | ICD-10-CM | POA: Insufficient documentation

## 2021-03-31 DIAGNOSIS — Z79899 Other long term (current) drug therapy: Secondary | ICD-10-CM | POA: Insufficient documentation

## 2021-03-31 DIAGNOSIS — S22000A Wedge compression fracture of unspecified thoracic vertebra, initial encounter for closed fracture: Secondary | ICD-10-CM

## 2021-03-31 DIAGNOSIS — W19XXXA Unspecified fall, initial encounter: Secondary | ICD-10-CM

## 2021-03-31 DIAGNOSIS — S24102A Unspecified injury at T2-T6 level of thoracic spinal cord, initial encounter: Secondary | ICD-10-CM | POA: Diagnosis present

## 2021-03-31 MED ORDER — ACETAMINOPHEN 325 MG PO TABS: 650.0000 mg | ORAL_TABLET | Freq: Once | ORAL | Status: AC

## 2021-03-31 MED ORDER — ACETAMINOPHEN 325 MG PO TABS
ORAL_TABLET | ORAL | Status: AC
  Administered 2021-03-31: 650 mg via ORAL
  Filled 2021-03-31: qty 2

## 2021-03-31 MED ORDER — TRAMADOL HCL 50 MG PO TABS: 50.0000 mg | ORAL_TABLET | Freq: Two times a day (BID) | ORAL | 0 refills | Status: AC | PRN

## 2021-03-31 NOTE — ED Triage Notes (Addendum)
Pt to ER via [facility] from [facility] after an unwitnessed fall today.   EMS report pt fell approximately 10 minutes prior to arrival, it appears she slided down the side of the bed but this is unconfirmed. Pt reports lower back pain and tenderness. Pt with previous healing injuries to forehead. No blood thinner usage, takes daily aspirin. Dementia at baseline.  Pt with bruising present to mid and low back.

## 2021-03-31 NOTE — ED Notes (Signed)
Patient transported to X-ray & CT °

## 2021-03-31 NOTE — ED Notes (Signed)
Pt unable to sign MSE waiver due to condition  

## 2021-03-31 NOTE — Discharge Instructions (Addendum)
Patient has a chronic compression fracture at her 6 thoracic vertebrae, this appears possibly slightly worse on x-ray although it is difficult to say.  Please follow-up with orthopedics for repeat imaging

## 2021-03-31 NOTE — ED Provider Notes (Signed)
[facility] Emergency Department Provider Note   ____________________________________________    I have reviewed the triage vital signs and the nursing notes.   HISTORY  Chief Complaint Fall   History limited by dementia  HPI Carol Beck is a 87 y.o. female with a history as noted below who presents after reported unwitnessed fall.  Patient complains only of some mild back pain.  Unclear whether she hit her head, no record of blood thinners on MAR Past Medical History:  Diagnosis Date  . CAD (coronary artery disease)    Native Vessel  . Hyperlipidemia, mixed   . Hypertension    unspecified  . Hypothyroidism   . Mild dementia (HCC)   . Restless leg syndrome   . Vertigo     Patient Active Problem List   Diagnosis Date Noted  . Dizziness 05/13/2013  . Murmur, cardiac 05/13/2013  . CHEST PAIN UNSPECIFIED 05/09/2010  . Carotid stenosis 01/10/2010  . HYPERLIPIDEMIA-MIXED 08/17/2009  . Essential hypertension 08/17/2009  . CAD, NATIVE VESSEL 08/17/2009    Past Surgical History:  Procedure Laterality Date  . EYE SURGERY    . FOOT SURGERY      Prior to Admission medications   Medication Sig Start Date End Date Taking? Authorizing Provider  aspirin 81 MG tablet Take 81 mg by mouth daily.      [provider]  Cholecalciferol (VITAMIN D) 2000 UNITS tablet Take 2,000 Units by mouth daily.    [provider]  donepezil (ARICEPT) 10 MG tablet Take 10 mg by mouth at bedtime.    [provider]  hydrochlorothiazide (HYDRODIURIL) 25 MG tablet Take 25 mg by mouth daily.    [provider]  hydrOXYzine (ATARAX/VISTARIL) 25 MG tablet Take 25 mg by mouth 3 (three) times daily.    [provider]  ketoconazole (NIZORAL) 2 % shampoo Apply 1 application topically 3 (three) times a week.     [provider]  levothyroxine (SYNTHROID, LEVOTHROID) 75 MCG tablet Take 75 mcg by mouth daily before  breakfast.    [provider]  lidocaine (LIDODERM) 5 % Place 1 patch onto the skin every 12 (twelve) hours. Remove & Discard patch within 12 hours or as directed by MD 07/01/20 07/01/21  Bridget Hartshorn L, PA-C  mirtazapine (REMERON) 7.5 MG tablet Take 7.5 mg by mouth at bedtime.    [provider]  ondansetron (ZOFRAN ODT) 4 MG disintegrating tablet Take 1 tablet (4 mg total) by mouth every 8 (eight) hours as needed for nausea or vomiting. 04/01/17   [name], MD  potassium chloride SA (K-DUR,KLOR-CON) 20 MEQ tablet Take 20 mEq by mouth daily.    [provider]  pravastatin (PRAVACHOL) 40 MG tablet Take 40 mg by mouth daily.    [provider]  risperiDONE (RISPERDAL) 0.25 MG tablet Take 0.25 mg by mouth daily.    [provider]  senna-docusate (SENOKOT-S) 8.6-50 MG per tablet Take 1 tablet by mouth 2 (two) times daily.     [provider]  traMADol (ULTRAM) 50 MG tablet Take 1 tablet (50 mg total) by mouth every 12 (twelve) hours as needed for severe pain. 03/31/21 03/31/22  Jene Every, MD  venlafaxine XR (EFFEXOR-XR) 75 MG 24 hr capsule Take 75 mg by mouth daily with breakfast.     [provider]     Allergies Evista [raloxifene], Shellfish allergy, and Sulfa antibiotics  Family History  Problem Relation Age of Onset  .  Hypertension Mother   . Heart attack Mother 52  . Heart disease Other     Social History Social History   Tobacco Use  . Smoking status: Former Smoker    Packs/day: 1.00    Years: 20.00    Pack years: 20.00    Types: Cigarettes    Quit date: 12/10/1980    Years since quitting: 40.3  . Smokeless tobacco: Never Used  Substance Use Topics  . Alcohol use: No  . Drug use: No    Review of Systems limited by dementia  Constitutional: No dizziness  ENT: No epistaxis Cardiovascular: Denies chest pain. Respiratory: Denies shortness of breath. Gastrointestinal: No abdominal pain.    Musculoskeletal: Positive back pain Skin: Shallow abrasion to the scalp Neurological: Denies weakness   ____________________________________________   PHYSICAL EXAM:  VITAL SIGNS: ED Triage Vitals  Enc Vitals Group     BP 03/31/21 0723 (!) 134/96     Pulse Rate 03/31/21 0723 70     Resp 03/31/21 0723 18     Temp 03/31/21 0723 97.9 F (36.6 C)     Temp Source 03/31/21 0723 Oral     SpO2 03/31/21 0723 96 %     Weight 03/31/21 0724 55 kg (121 lb 4.1 oz)     Height 03/31/21 0724 1.6 m (5\' 3" )     Head Circumference --      Peak Flow --      Pain Score --      Pain Loc --      Pain Edu? --      Excl. in GC? --     Constitutional: Alert Eyes: Conjunctivae are normal.  Head: Shallow abrasion left forehead Nose: No congestion/rhinnorhea. Mouth/Throat: Mucous membranes are moist.   Neck:  Painless ROM, no pain with axial load Cardiovascular: Normal rate, regular rhythm. Good peripheral circulation.  No chest wall tenderness palpation, clavicles nontender Respiratory: Normal respiratory effort.  No retractions. Lungs CTAB. Gastrointestinal: Soft and nontender. No distention.  No CVA tenderness.  Musculoskeletal: Patient with contusion/bruise to the left paraspinal lower thoracic area, no bony abnormalities palpated, no vertebral tenderness to palpation.  Normal range of motion of all extremities without discomfort, no pain with axial load on both hips Neurologic:  Normal speech and language. No gross focal neurologic deficits are appreciated.  Skin:  Skin is warm, dry.  As above Psychiatric: Mood and affect are normal. Speech and behavior are normal.  ____________________________________________   LABS (all labs ordered are listed, but only abnormal results are displayed)  Labs Reviewed - No data to display ____________________________________________  EKG  None ____________________________________________  RADIOLOGY  CT head, cervical spine, thoracic and lumbar  x-rays ____________________________________________   PROCEDURES  Procedure(s) performed: No  Procedures   Critical Care performed: No ____________________________________________   INITIAL IMPRESSION / ASSESSMENT AND PLAN / ED COURSE  Pertinent labs & imaging results that were available during my care of the patient were reviewed by me and considered in my medical decision making (see chart for details).  Patient presents after unwitnessed fall, it is described that she may have slid out of her bed, unclear.  She does have a bruise to the back as detailed above.  Shallow abrasion to the forehead, will obtain CT head, cervical spine and thoracic and lumbar x-rays  Imaging is overall reassuring.  Thoracic spine demonstrates possible increased T6 superior endplate compression fracture since September of last year.  Unclear if this is related to today.  Patient is neuro  intact.  Appropriate for discharge with pain control outpatient follow-up with Ortho as needed  ____________________________________________   FINAL CLINICAL IMPRESSION(S) / ED DIAGNOSES  Final diagnoses:  Fall, initial encounter  Compression fracture of body of thoracic vertebra [facility])        Note:  This document was prepared using Dragon voice recognition software and may include unintentional dictation errors.   Jene Every, MD 03/31/21 [phone]

## 2021-05-07 ENCOUNTER — Other Ambulatory Visit: Payer: Self-pay

## 2021-05-07 ENCOUNTER — Emergency Department: Payer: Medicare Other

## 2021-05-07 ENCOUNTER — Emergency Department
Admission: EM | Admit: 2021-05-07 | Discharge: 2021-05-07 | Disposition: A | Discharge status: Home / Self Care | Payer: Medicare Other | Attending: Emergency Medicine | Admitting: Emergency Medicine

## 2021-05-07 DIAGNOSIS — I10 Essential (primary) hypertension: Secondary | ICD-10-CM | POA: Insufficient documentation

## 2021-05-07 DIAGNOSIS — F0390 Unspecified dementia without behavioral disturbance: Secondary | ICD-10-CM | POA: Insufficient documentation

## 2021-05-07 DIAGNOSIS — Y92129 Unspecified place in nursing home as the place of occurrence of the external cause: Secondary | ICD-10-CM | POA: Diagnosis not present

## 2021-05-07 DIAGNOSIS — Z87891 Personal history of nicotine dependence: Secondary | ICD-10-CM | POA: Diagnosis not present

## 2021-05-07 DIAGNOSIS — Z7982 Long term (current) use of aspirin: Secondary | ICD-10-CM | POA: Insufficient documentation

## 2021-05-07 DIAGNOSIS — X58XXXA Exposure to other specified factors, initial encounter: Secondary | ICD-10-CM | POA: Diagnosis not present

## 2021-05-07 DIAGNOSIS — S40022A Contusion of left upper arm, initial encounter: Secondary | ICD-10-CM | POA: Insufficient documentation

## 2021-05-07 DIAGNOSIS — S4992XA Unspecified injury of left shoulder and upper arm, initial encounter: Secondary | ICD-10-CM

## 2021-05-07 DIAGNOSIS — E039 Hypothyroidism, unspecified: Secondary | ICD-10-CM | POA: Insufficient documentation

## 2021-05-07 DIAGNOSIS — I2510 Atherosclerotic heart disease of native coronary artery without angina pectoris: Secondary | ICD-10-CM | POA: Diagnosis not present

## 2021-05-07 DIAGNOSIS — Z79899 Other long term (current) drug therapy: Secondary | ICD-10-CM | POA: Insufficient documentation

## 2021-05-07 NOTE — ED Triage Notes (Signed)
Staff noticed bruising left upper arm, unknown etiology, facility MD advised send out for evaluation.  Pt arrives with DNR in place, gold sheet provided by ems.  Pt alert, disoriented at baseline, follows commands, cooperative with care

## 2021-05-07 NOTE — ED Provider Notes (Signed)
[facility] Emergency Department Provider Note   ____________________________________________   Event Date/Time   First MD Initiated Contact with Patient 05/07/21 1057     (approximate)  I have reviewed the triage vital signs and the nursing notes.   HISTORY  Chief Complaint Arm Injury    HPI Carol Beck is a 87 y.o. female who presents for a hematoma to the left upper extremity  LOCATION: Left upper extremity DURATION: 2 days TIMING: Slightly improved since onset SEVERITY: Mild QUALITY: Aching CONTEXT: Sustained after a fall yesterday MODIFYING FACTORS: Tenderness to palpation ASSOCIATED SYMPTOMS: Denies   Per medical record review noncontributory          Past Medical History:  Diagnosis Date   CAD (coronary artery disease)    Native Vessel   Hyperlipidemia, mixed    Hypertension    unspecified   Hypothyroidism    Mild dementia (HCC)    Restless leg syndrome    Vertigo     Patient Active Problem List   Diagnosis Date Noted   Dizziness 05/13/2013   Murmur, cardiac 05/13/2013   CHEST PAIN UNSPECIFIED 05/09/2010   Carotid stenosis 01/10/2010   HYPERLIPIDEMIA-MIXED 08/17/2009   Essential hypertension 08/17/2009   CAD, NATIVE VESSEL 08/17/2009    Past Surgical History:  Procedure Laterality Date   EYE SURGERY     FOOT SURGERY      Prior to Admission medications   Medication Sig Start Date End Date Taking? Authorizing Provider  aspirin 81 MG tablet Take 81 mg by mouth daily.      [provider]  Cholecalciferol (VITAMIN D) 2000 UNITS tablet Take 2,000 Units by mouth daily.    [provider]  donepezil (ARICEPT) 10 MG tablet Take 10 mg by mouth at bedtime.    [provider]  hydrochlorothiazide (HYDRODIURIL) 25 MG tablet Take 25 mg by mouth daily.    [provider]  hydrOXYzine (ATARAX/VISTARIL) 25 MG tablet Take 25 mg by mouth 3 (three) times daily.    [provider]   ketoconazole (NIZORAL) 2 % shampoo Apply 1 application topically 3 (three) times a week.     [provider]  levothyroxine (SYNTHROID, LEVOTHROID) 75 MCG tablet Take 75 mcg by mouth daily before breakfast.    [provider]  lidocaine (LIDODERM) 5 % Place 1 patch onto the skin every 12 (twelve) hours. Remove & Discard patch within 12 hours or as directed by MD 07/01/20 07/01/21  Bridget Hartshorn L, PA-C  mirtazapine (REMERON) 7.5 MG tablet Take 7.5 mg by mouth at bedtime.    [provider]  ondansetron (ZOFRAN ODT) 4 MG disintegrating tablet Take 1 tablet (4 mg total) by mouth every 8 (eight) hours as needed for nausea or vomiting. 04/01/17   [name], MD  potassium chloride SA (K-DUR,KLOR-CON) 20 MEQ tablet Take 20 mEq by mouth daily.    [provider]  pravastatin (PRAVACHOL) 40 MG tablet Take 40 mg by mouth daily.    [provider]  risperiDONE (RISPERDAL) 0.25 MG tablet Take 0.25 mg by mouth daily.    [provider]  senna-docusate (SENOKOT-S) 8.6-50 MG per tablet Take 1 tablet by mouth 2 (two) times daily.     [provider]  traMADol (ULTRAM) 50 MG tablet Take 1 tablet (50 mg total) by mouth every 12 (twelve) hours as needed for severe pain. 03/31/21 03/31/22  Jene Every, MD  venlafaxine XR (EFFEXOR-XR) 75 MG 24 hr capsule Take  75 mg by mouth daily with breakfast.     [provider]    Allergies Evista [raloxifene], Shellfish allergy, and Sulfa antibiotics  Family History  Problem Relation Age of Onset   Hypertension Mother    Heart attack Mother 41   Heart disease Other     Social History Social History   Tobacco Use   Smoking status: Former    Packs/day: 1.00    Years: 20.00    Pack years: 20.00    Types: Cigarettes    Quit date: 12/10/1980    Years since quitting: 40.4   Smokeless tobacco: Never  Substance Use Topics   Alcohol use: No   Drug use: No    Review of  Systems Constitutional: No fever/chills Eyes: No visual changes. ENT: No sore throat. Cardiovascular: Denies chest pain. Respiratory: Denies shortness of breath. Gastrointestinal: No abdominal pain.  No nausea, no vomiting.  No diarrhea. Genitourinary: Negative for dysuria. Musculoskeletal: Positive for acute left upper extremity pain with associated hematoma Skin: Negative for rash. Neurological: Negative for headaches, weakness/numbness/paresthesias in any extremity Psychiatric: Negative for suicidal ideation/homicidal ideation   ____________________________________________   PHYSICAL EXAM:  VITAL SIGNS: ED Triage Vitals  Enc Vitals Group     BP 05/07/21 1102 133/62     Pulse Rate 05/07/21 1219 74     Resp 05/07/21 1102 16     Temp 05/07/21 1102 98.2 F (36.8 C)     Temp Source 05/07/21 1102 Oral     SpO2 05/07/21 1056 96 %     Weight 05/07/21 1059 121 lb 14.4 oz (55.3 kg)     Height 05/07/21 1059 5\' 2"  (1.575 m)     Head Circumference --      Peak Flow --      Pain Score 05/07/21 1059 0     Pain Loc --      Pain Edu? --      Excl. in GC? --    Constitutional: Alert and oriented. Well appearing and in no acute distress. Eyes: Conjunctivae are normal. PERRL. Head: Atraumatic. Nose: No congestion/rhinnorhea. Mouth/Throat: Mucous membranes are moist. Neck: No stridor Cardiovascular: Grossly normal heart sounds.  Good peripheral circulation. Respiratory: Normal respiratory effort.  No retractions. Gastrointestinal: Soft and nontender. No distention. Musculoskeletal: No obvious deformities Neurologic:  Normal speech and language. No gross focal neurologic deficits are appreciated. Skin:  Skin is warm and dry.  4 cm x 8 cm area of ecchymosis to the posterior left arm with associated tenderness to palpation and without limited range of motion Psychiatric: Mood and affect are normal. Speech and behavior are normal.  ____________________________________________    LABS (all labs ordered are listed, but only abnormal results are displayed)  Labs Reviewed - No data to display   RADIOLOGY  ED MD interpretation: X-ray of the left shoulder and humerus show no evidence of acute fractures or dislocations  Official radiology report(s): DG Shoulder Left  Result Date: 05/07/2021 CLINICAL DATA:  Left shoulder and upper arm bruising. Unknown cause. EXAM: LEFT SHOULDER - 2+ VIEW COMPARISON:  06/30/2018 FINDINGS: Interval healing of the previously demonstrated distal left clavicle fracture. No acute fracture or dislocation. IMPRESSION: No acute abnormality. Electronically Signed   By: 08/30/2018 M.D.   On: 05/07/2021 11:52   DG Humerus Left  Result Date: 05/07/2021 CLINICAL DATA:  Left upper arm bruising.  No known cause. EXAM: LEFT HUMERUS - 2+ VIEW COMPARISON:  Left shoulder radiographs obtained at the same time. FINDINGS: Normal  appearing left humerus other than diffuse osteopenia. No fracture or dislocation. IMPRESSION: No fracture. Electronically Signed   By: Beckie Salts M.D.   On: 05/07/2021 11:53    ____________________________________________   PROCEDURES  Procedure(s) performed (including Critical Care):  Procedures   ____________________________________________   INITIAL IMPRESSION / ASSESSMENT AND PLAN / ED COURSE  As part of my medical decision making, I reviewed the following data within the electronic medical record, if available:  Nursing notes reviewed and incorporated, Labs reviewed, EKG interpreted, Old chart reviewed, Radiograph reviewed and Notes from prior ED visits reviewed and incorporated       87 year old female presents after a fall complaining of a contusion to the posterior the left arm Given history, exam and workup I have low suspicion for fracture, dislocation, significant ligamentous injury, septic arthritis, gout flare, new autoimmune arthropathy, or gonococcal arthropathy.  Interventions: X-ray of the left arm  and shoulder show no evidence of acute abnormalities Disposition: Discharge home with strict return precautions and instructions for prompt primary care follow up in the next week.      ____________________________________________   FINAL CLINICAL IMPRESSION(S) / ED DIAGNOSES  Final diagnoses:  Injury of left upper extremity, initial encounter  Contusion of left upper extremity, initial encounter     ED Discharge Orders     None        Note:  This document was prepared using Dragon voice recognition software and may include unintentional dictation errors.    Merwyn Katos, MD 05/07/21 [phone]

## 2021-05-07 NOTE — ED Notes (Signed)
Discharged home with daughter.

## 2021-05-07 NOTE — ED Notes (Signed)
ED Provider at bedside. 

## 2021-05-07 NOTE — ED Notes (Signed)
Patient transported to X-ray 

## 2021-11-23 ENCOUNTER — Institutional Professional Consult (permissible substitution): Payer: Medicare Other | Admitting: Internal Medicine

## 2021-12-25 ENCOUNTER — Telehealth: Payer: Self-pay | Admitting: Internal Medicine

## 2021-12-25 NOTE — Telephone Encounter (Signed)
Spoke to patient's daughter, Jennifer(DPR). Victorino Dike is requesting for consult 12/28/2021 to be virtual do to patient being so frail.    Dr. Sherene Sires, please advise. Thanks

## 2021-12-25 NOTE — Telephone Encounter (Signed)
I called the daughter (DPR) and I let her know that I have changed the visit per Dr. Melvyn Novas recommendations. Nothing further needed.

## 2021-12-25 NOTE — Telephone Encounter (Signed)
That's fine

## 2021-12-25 NOTE — Telephone Encounter (Signed)
Patient's daughter came by and signed medical release form for CXR.  Request has been faxed to doctors making house calls.

## 2021-12-28 ENCOUNTER — Other Ambulatory Visit: Payer: Self-pay

## 2021-12-28 ENCOUNTER — Telehealth: Payer: Self-pay | Admitting: Internal Medicine

## 2021-12-28 ENCOUNTER — Encounter: Payer: Self-pay | Admitting: Internal Medicine

## 2021-12-28 ENCOUNTER — Telehealth (INDEPENDENT_AMBULATORY_CARE_PROVIDER_SITE_OTHER): Payer: Medicare Other | Admitting: Internal Medicine

## 2021-12-28 DIAGNOSIS — J690 Pneumonitis due to inhalation of food and vomit: Secondary | ICD-10-CM | POA: Insufficient documentation

## 2021-12-28 MED ORDER — FAMOTIDINE 20 MG PO TABS: ORAL_TABLET | ORAL | 11 refills | Status: DC

## 2021-12-28 MED ORDER — PANTOPRAZOLE SODIUM 40 MG PO TBEC: 40.0000 mg | DELAYED_RELEASE_TABLET | Freq: Every day | ORAL | 2 refills | Status: DC

## 2021-12-28 NOTE — Progress Notes (Signed)
? ?Carol Beck, female    DOB: 1927/09/12,    MRN: [id number] ? ? ?Brief patient profile:  ?4   yowm  quit smoking about age 88's  self referred to pulmonary clinic in [facility] [place]  12/28/2021 presently lives in Assisted living with Oct 2022 with onset of cough/ congestion ? P mold exposure in Sept 2022.  Dx with pna and better p  augmentin /zpak/doxy/doxy last rx was Feb 16th 2023 and worse again since end of Feb 2023  ? ? ? ? ?History of Present Illness  ?12/28/2021  Pulmonary  eval/ Jaquilla Woodroof / [facility] . Virtual visit  ? ?Virtual Visit via Telephone Note 12/28/2021  ? ?I connected with VILLA BURGIN on 12/28/21 at 11:00 AM EST by myChart video link  and verified that I am speaking with the correct person using two identifiers. Pt is at home and this visit was made from my office with Victorino Dike  ?  ?I discussed the limitations, risks, security and privacy concerns of performing an evaluation and management service virtually and the availability of in person appointments. I also discussed with the patient that there may be a patient responsible charge related to this service. The patient expressed understanding and agreed to proceed. ? ?   ?Chief Complaint  ?Patient presents with  ? pulmonary consult  ?  hx of PNA. Last dx 08/2021. Since she has had lingering chest congestion and wheezing.  ? Dyspnea:  walks with PT 5-10 min  ?Cough: clear drainage/ swallowing has been difficult/ ST pending but eating regular food  ?Sleeping: sleeping at about 30 degrees  ?SABA use: neb bid  ?02: none  ?In snf due to severe dementia, palliative care to see / already full NCB and declined feeding tubes/ trachs  ? ?Speech therapy has not seen yet but planned.  Seems like the cough is worse p meals but on regular diet so far and not coughing up food or purulent material at present but does wheeze on FVC maneuver which is clearly upper airway in quality  ? ?No obvious other patterns in day to day or daytime variability or assoc  excess/ purulent sputum or mucus plugs or hemoptysis or cp or chest tightness,  or overt sinus or hb symptoms.  ? ?Apparently sleeping at about 30 degrees  without nocturnal  or early am exacerbation  of respiratory  c/o's or need for noct saba. Also denies any obvious fluctuation of symptoms with weather or environmental changes or other aggravating or alleviating factors except as outlined above  ? ?No unusual exposure hx or h/o childhood pna/ asthma or knowledge of premature birth. ? ?Current Allergies, Complete Past Medical History, Past Surgical History, Family History, and Social History were reviewed in [employer] record. ? ?ROS  The following are not active complaints unless bolded ?Hoarseness, sore throat, dysphagia, dental problems, itching, sneezing,  nasal congestion or discharge of excess mucus or purulent secretions, ear ache,   fever, chills, sweats, unintended wt loss or wt gain, classically pleuritic or exertional cp,  orthopnea pnd or arm/hand swelling  or leg swelling, presyncope, palpitations, abdominal pain, anorexia, nausea, vomiting, diarrhea  or change in bowel habits or change in bladder habits, change in stools or change in urine, dysuria, hematuria,  rash, arthralgias, visual complaints, headache, numbness, weakness or ataxia or problems with walking or coordination,  change in mood or  memory. ?      ?   ? ?Past Medical History:  ?Diagnosis  Date  ? CAD (coronary artery disease)   ? Native Vessel  ? Hyperlipidemia, mixed   ? Hypertension   ? unspecified  ? Hypothyroidism   ? Mild dementia   ? Restless leg syndrome   ? Vertigo   ? ? ?Outpatient Medications Prior to Visit  ?Medication Sig Dispense Refill  ? aspirin 81 MG tablet Take 81 mg by mouth daily.      ? Cholecalciferol (VITAMIN D) 2000 UNITS tablet Take 2,000 Units by mouth daily.    ? ipratropium-albuterol (DUONEB) 0.5-2.5 (3) MG/3ML SOLN Take 3 mLs by nebulization 2 (two) times daily.    ? lactose free  nutrition (BOOST) LIQD Take by mouth.    ? levothyroxine (SYNTHROID) 50 MCG tablet Take by mouth.    ? loratadine (CLARITIN) 10 MG tablet Take 10 mg by mouth daily.    ? Magnesium Oxide 400 MG CAPS Take 1 capsule by mouth daily.    ? MUCINEX 600 MG 12 hr tablet Take 1,200 mg by mouth 2 (two) times daily.    ? nystatin cream (MYCOSTATIN) Apply topically.    ? potassium chloride SA (K-DUR,KLOR-CON) 20 MEQ tablet Take 20 mEq by mouth daily.    ? PROZAC 40 MG capsule Take 40 mg by mouth daily.    ? risperiDONE (RISPERDAL) 0.25 MG tablet Take 0.25 mg by mouth daily.    ? traMADol (ULTRAM) 50 MG tablet Take 1 tablet (50 mg total) by mouth every 12 (twelve) hours as needed for severe pain. 20 tablet 0  ? ketoconazole (NIZORAL) 2 % shampoo Apply 1 application topically 3 (three) times a week.     ? levothyroxine (SYNTHROID, LEVOTHROID) 75 MCG tablet Take 75 mcg by mouth daily before breakfast.    ? mirtazapine (REMERON) 7.5 MG tablet Take 7.5 mg by mouth at bedtime.    ? pravastatin (PRAVACHOL) 40 MG tablet Take 40 mg by mouth daily.    ? donepezil (ARICEPT) 10 MG tablet Take 10 mg by mouth at bedtime.    ? ferrous gluconate (FERGON) 240 (27 FE) MG tablet Take 1 tablet by mouth daily.    ? hydrochlorothiazide (HYDRODIURIL) 25 MG tablet Take 25 mg by mouth daily.    ? hydrOXYzine (ATARAX/VISTARIL) 25 MG tablet Take 25 mg by mouth 3 (three) times daily.    ? ondansetron (ZOFRAN ODT) 4 MG disintegrating tablet Take 1 tablet (4 mg total) by mouth every 8 (eight) hours as needed for nausea or vomiting. 20 tablet 0  ? senna-docusate (SENOKOT-S) 8.6-50 MG per tablet Take 1 tablet by mouth 2 (two) times daily.     ? SPIRIVA RESPIMAT 1.25 MCG/ACT AERS Inhale into the lungs.    ? venlafaxine XR (EFFEXOR-XR) 75 MG 24 hr capsule Take 75 mg by mouth daily with breakfast.     ? ?No facility-administered medications prior to visit.  ? ? ? ?Objective:  ?  ? ?By camera looks very frail lying in bed chronically but not acutely ill/ very  advanced age in appearance but able to cough /fvc voluntarily to request - cough is dry sounding with obvious upper airway wheezing on exp, none on inspiration  ? ?  ? ? ?   ?Assessment  ? ?Recurrent aspiration pneumonia (HCC) ?Onset fall 2022  ? ?Clinically responsive to abx but risk of generating resistant abx including C diff is problematic here as the problem is not infection so much a inflammation and continued apparent upper airways dysfunction related to geriatric decline but nothing  that can be easily corrected with medications. ? ?I do rec ?ST to see asap and soft mechanical diet in meantime advised  ?GERD Diet ?Pantoprazole (protonix) 40 mg   Take  30-60 min before first meal of the day and Pepcid (famotidine)  20 mg after supper until return to office - this is the best way to tell whether stomach acid is contributing to your problem.    ?Continue elevate HOB  ?Avoid over using abx in this setting but decision is best made by providers at [facility]  ? ?Come to office if possible for examination if possible vs shift to palliative approach ? ?    ?  ? ?Each maintenance medication was reviewed in detail including emphasizing most importantly the difference between maintenance and prns and under what circumstances the prns are to be triggered using an action plan format where appropriate. ? ?Total time for H and P, chart review, counseling,  and generating customized AVS unique to this office visit / same day charting =  25 min  ?     ? ? ? ? ?Sandrea Hughs, MD ?12/28/2021 ?    ?

## 2021-12-28 NOTE — Assessment & Plan Note (Signed)
Onset fall 2022  ? ?Clinically responsive to abx but risk of generating resistant abx including C diff is problematic here as the problem is not infection so much a inflammation and continued apparent upper airways dysfunction related to geriatric decline but nothing that can be easily corrected with medications. ? ?I do rec ?ST to see asap and soft mechanical diet in meantime advised  ?GERD Diet ?Pantoprazole (protonix) 40 mg   Take  30-60 min before first meal of the day and Pepcid (famotidine)  20 mg after supper until return to office - this is the best way to tell whether stomach acid is contributing to your problem.    ?Continue elevate HOB  ?Avoid over using abx in this setting but decision is best made by providers at [facility]  ? ?Come to office if possible for examination if possible vs shift to palliative approach ? ?    ?  ? ?Each maintenance medication was reviewed in detail including emphasizing most importantly the difference between maintenance and prns and under what circumstances the prns are to be triggered using an action plan format where appropriate. ? ?Total time for H and P, chart review, counseling,  and generating customized AVS unique to this office visit / same day charting =  25 min  ?     ?

## 2021-12-28 NOTE — Patient Instructions (Addendum)
Pantoprazole (protonix) 40 mg   Take  30-60 min before first meal of the day and Pepcid (famotidine)  20 mg after supper until return to office - this is the best way to tell whether stomach acid is contributing to your problem.  ? ?GERD (REFLUX)  is an extremely common cause of respiratory symptoms just like yours , many times with no obvious heartburn at all.  ? ? It can be treated with medication, but also with lifestyle changes including elevation of the head of your bed (ideally with 6 -8inch blocks under the headboard of your bed),  Smoking cessation, avoidance of late meals, excessive alcohol, and avoid fatty foods, chocolate, peppermint, colas, red wine, and acidic juices such as orange juice.  ?NO MINT OR MENTHOL PRODUCTS SO NO COUGH DROPS  ?USE SUGARLESS CANDY INSTEAD (Jolley ranchers or Stover's or Life Savers) or even ice chips will also do - the key is to swallow to prevent all throat clearing. ?NO OIL BASED VITAMINS - use powdered substitutes.  Avoid fish oil when coughing.   ? ?Proceed with speech therapy ASAP  ? ?Late add could consider ENT eval after in office eval here if patient can make the trip but I suspect this would be low yield  ? ? ?

## 2021-12-29 NOTE — Telephone Encounter (Signed)
Dr Sherene Sires Please advise ? ? ? ?Patients daughter states pt cant take Pantoprazole 40mg  as it can not be crushed, they are requesting Prilosec. Can you please send this in for patient? ?

## 2022-01-01 NOTE — Telephone Encounter (Signed)
Spoke to patient's daughter, Victorino Dike and relayed below message.  ?Victorino Dike is questioning if prilosec can be taken with Synthroid and iron supplement. ? ?Dr. Sherene Sires, please advise. Thanks ?

## 2022-01-01 NOTE — Telephone Encounter (Signed)
Spoke with Carol Beck and reviewed Dr. Thurston Hole message. Carol Beck stated understanding. Nothing further needed at this time.  ?

## 2022-01-01 NOTE — Telephone Encounter (Signed)
Yes to all 3  - the synthroid may be absorbed a bit differently but probably not enough to make any difference and the TSH needs to be be checked routinely anyway so the dose can be adjusted accordingly but that's only needed if ends up on it more than 3 month trial basis intended here. ?

## 2022-01-08 ENCOUNTER — Telehealth: Payer: Self-pay | Admitting: Internal Medicine

## 2022-01-08 MED ORDER — OMEPRAZOLE 40 MG PO CPDR: 40.0000 mg | DELAYED_RELEASE_CAPSULE | Freq: Every day | ORAL | 3 refills | Status: DC

## 2022-01-08 NOTE — Telephone Encounter (Signed)
Called and spoke with patient's daughter Carol Beck who states that patient's RX was supposed to be switched from Protonix to Prilosec. I don't see that RX was sent in but there is a message from Dr. Sherene Sires to switch them. RX has been sent to preferred pharmacy for Prilosec and I have removed Protonix from her med list. Nothing further needed at this time.  ? ?Carol Cowden, MD ?to [name], CMA   ?  11:16 AM ?OK to call in Prilosec 40 mg in its place  ?

## 2022-01-17 ENCOUNTER — Other Ambulatory Visit: Payer: Self-pay

## 2022-01-17 ENCOUNTER — Encounter: Payer: Self-pay | Admitting: Nurse Practitioner

## 2022-01-17 ENCOUNTER — Non-Acute Institutional Stay: Payer: Medicare Other | Admitting: Nurse Practitioner

## 2022-01-17 VITALS — HR 80 | Resp 18 | Wt 117.3 lb

## 2022-01-17 DIAGNOSIS — Z515 Encounter for palliative care: Secondary | ICD-10-CM

## 2022-01-17 DIAGNOSIS — R0602 Shortness of breath: Secondary | ICD-10-CM

## 2022-01-17 DIAGNOSIS — R630 Anorexia: Secondary | ICD-10-CM

## 2022-01-17 NOTE — Progress Notes (Signed)
? ? ?Manufacturing engineer ?Community Palliative Care Consult Note ?Telephone: [phone]  ?Fax: [phone]  ? ?Date of encounter: 01/17/22 ?10:59 AM ?PATIENT NAME: Carol Beck ?[address] ?[place] [address]   ?[phone] (home)  ?DOB: [birth year] ?MRN: [id number] ?PRIMARY CARE PROVIDER:   Blenda Peals [facility] ?Virgie Dad, NP ?REFERRING PROVIDER:   ?[facility] ?[address] ?[place],  [place] [address] ?[phone] ? ?RESPONSIBLE PARTY:    ?Contact Information   ? ? Name Relation Home Work Mobile  ? Bertram Savin Daughter [phone]    ? Regina Eck Daughter   [phone]  ? ?  ? ?I met face to face with patient in facility. Palliative Care was asked to follow this patient by consultation request of  [facility] to address advance care planning and complex medical decision making. This is the initial visit.                       ?ASSESSMENT AND PLAN / RECOMMENDATIONS:  ?Advance Care Planning/Goals of Care: Goals include to maximize quality of life and symptom management. Patient/health care surrogate gave his/her permission to discuss.Our advance care planning conversation included a discussion about:    ?The value and importance of advance care planning  ?Experiences with loved ones who have been seriously ill or have died  ?Exploration of personal, cultural or spiritual beliefs that might influence medical decisions  ?Exploration of goals of care in the event of a sudden injury or illness  ?Identification  of a healthcare agent  ?Review and updating or creation of an  advance directive document . ?Decision not to resuscitate or to de-escalate disease focused treatments due to poor prognosis. ?CODE STATUS: DNR discussion with daughter, Carol Beck ? ?Symptom Management/Plan: ?1. Advance Care Planning;  DNR; [facility] at facility ? ?2. Anorexia secondary to dementia; Monitor weights, continue encourage to eat. Muscle wasting;  ?12/22/2020 weight 125  lbs ?12/03/2021 weight 117.3 llbs ?7.7 lbs loss over last year.  ? ?Left arm 23 cm ?Left leg 37 cm ? ?3. Shortness of breath; not on O2; continue to monitor while eating/drinking with concern for silent aspiration. Monitor respiratory with early interventions for PNA. Continue to follow Pulmonary. ? ?4. Goals of Care: Goals include to maximize quality of life and symptom management. Our advance care planning conversation included a discussion about:    ?The value and importance of advance care planning  ?Exploration of personal, cultural or spiritual beliefs that might influence medical decisions  ?Exploration of goals of care in the event of a sudden injury or illness  ?Identification and preparation of a healthcare agent  ?Review and updating or creation of an advance directive document. ? ?5. Palliative care encounter; Palliative care encounter; Palliative medicine team will continue to support patient, patient's family, and medical team. Visit consisted of counseling and education dealing with the complex and emotionally intense issues of symptom management and palliative care in the setting of serious and potentially life-threatening illness ? ?Follow up Palliative Care Visit: Palliative care will continue to follow for complex medical decision making, advance care planning, and clarification of goals. Return 4 weeks or prn. ? ?I spent 62 minutes providing this consultation. More than 50% of the time in this consultation was spent in counseling and care coordination. ?PPS: 40% ? ?Chief Complaint: Initial palliative consult for complex medical decision making ? ?HISTORY OF PRESENT ILLNESS:  PRITIKA Beck is a 88 y.o. year old female  with  multiple medical problems including Vascular dementia, CAD, HTN, heart murmur, carotid stenosis, HLD, hypothyroidism, RLS, depression, anxiety with delusions. Carol Beck resides at [facility]. Carol Beck does ambulate with a walker, currently working with physical and  speech therapy. Carol Beck does require assistance with ADL's including bathing, dressing. She toilets herself. Carol Beck does feed herself with varied appetite. Staff endorses cognitive impairment recent memory, recall, though able to remember remote. Recent fall about a week ago with hip pain, resolved and no changes in functional ability. At present Carol Beck is lying in bed, working with speech therapist as she finishes their session. Carol Beck and I talked about how she was feeling. Carol Beck was able to answer simple questions about remote family history, able to say she had 4 adult girls. We talked about what she had for breakfast and unable to remember. We talked about ros, food, daily routine though limited with cognitive impairment. Emotional support provided. Carol Beck was cooperative with assessment. I called Carol Beck daughter, Carol Beck. We talked about pc, past medical history, events which lead up to placement at ALF. We talked about ros, symptoms, cognitive and functional decline. We talked about [name] working with PT and speech. We talked about Pulmonary consult. We talked about 06/2021 mold exposure; 07/2021 PNA with abx; 08/2021 PNA with abx; 09/2021 congestion on xray; 10/2021 congestion on xray. Concern for silent aspiration. We talked about continuing to work with therapy currently will revisit in 4 weeks to see if further decline. We briefly discussed Hospice though will wait until therapies are completed. During discussion with Carol Beck, Carol Beck walked by the room at a quick pace with her walker. Will f/u in 4 weeks. Appointment scheduled.  ? ?History obtained from review of EMR, discussion with facility staff and Ms. Janota.  ?I reviewed available labs, medications, imaging, studies and related documents from the EMR.  Records reviewed and summarized above.  ? ?ROS ?10 point system reviewed with staff as Carol Beck is cognitively impaired all negative except HPI ? ?Physical  Exam: ?Constitutional: NAD ?General: frail appearing, thin, pleasantly female ?EYES: lids intact ?ENMT: oral mucous membranes moist ?CV: S1S2, RRR ?Pulmonary: clear, decrease bases, no increased work of breathing, no cough, room air ?Abdomen: normo-active BS + 4 quadrants, soft and non tender ?MSK: ambulatory with walker; +muscle wasting ?Skin: warm and dry ?Neuro:  + generalized weakness,  + cognitive impairment ?Psych: non-anxious affect, A and Oriented to self ?CURRENT PROBLEM LIST:  ?Patient Active Problem List  ? Diagnosis Date Noted  ? Recurrent aspiration pneumonia ([place]) 12/28/2021  ? Dizziness 05/13/2013  ? Murmur, cardiac 05/13/2013  ? CHEST PAIN UNSPECIFIED 05/09/2010  ? Carotid stenosis 01/10/2010  ? HYPERLIPIDEMIA-MIXED 08/17/2009  ? Essential hypertension 08/17/2009  ? CAD, NATIVE VESSEL 08/17/2009  ? ?PAST MEDICAL HISTORY:  ?Active Ambulatory Problems  ?  Diagnosis Date Noted  ? HYPERLIPIDEMIA-MIXED 08/17/2009  ? Essential hypertension 08/17/2009  ? CAD, NATIVE VESSEL 08/17/2009  ? Carotid stenosis 01/10/2010  ? CHEST PAIN UNSPECIFIED 05/09/2010  ? Dizziness 05/13/2013  ? Murmur, cardiac 05/13/2013  ? Recurrent aspiration pneumonia ([place]) 12/28/2021  ? ?Resolved Ambulatory Problems  ?  Diagnosis Date Noted  ? No Resolved Ambulatory Problems  ? ?Past Medical History:  ?Diagnosis Date  ? CAD (coronary artery disease)   ? Hyperlipidemia, mixed   ? Hypertension   ? Hypothyroidism   ? Mild dementia   ? Restless leg syndrome   ? Vertigo   ? ?SOCIAL HX:  ?Social  History  ? ?Tobacco Use  ? Smoking status: Former  ?  Packs/day: 1.00  ?  Years: 15.00  ?  Pack years: 15.00  ?  Types: Cigarettes  ?  Quit date: 48  ?  Years since quitting: 58.2  ? Smokeless tobacco: Never  ?Substance Use Topics  ? Alcohol use: No  ? ?FAMILY HX:  ?Family History  ?Problem Relation Age of Onset  ? Hypertension Mother   ? Heart attack Mother 26  ? Heart disease Other   ?   ? ?ALLERGIES:  ?Allergies  ?Allergen Reactions  ? Evista  [Raloxifene] Other (See Comments)  ?  Reaction: unknown  ? Shellfish Allergy Other (See Comments)  ?  Reaction: unknown   ? Sulfa Antibiotics Other (See Comments)  ?  Reaction: unknown  ?   ?PERTINENT MEDICATIONS:  ?Outpatient

## 2022-01-29 ENCOUNTER — Telehealth: Payer: Self-pay | Admitting: Internal Medicine

## 2022-01-29 MED ORDER — CEFDINIR 300 MG PO CAPS: 300.0000 mg | ORAL_CAPSULE | Freq: Two times a day (BID) | ORAL | 0 refills | Status: DC

## 2022-01-29 NOTE — Telephone Encounter (Signed)
Called and spoke with pt's daughter Victorino Dike who states that pt has been wheezing more which she stated began about 2 days ago. States that pt has not been complaining about the wheezing but states when pt wheezes, it is loud when she does it. ? ?Asked if pt was coughing any and she said when pt laughs, she will begin to cough. Pt is not coughing up any phlegm. ? ? ?Victorino Dike stated that in the past when pt had problems with wheezing, her PCP would usually prescribe antibiotics. I stated to her that usually abx are not prescribed for wheezing as steroids are usually what is prescribed but she stated that they really do not want to put pt on steroids due to her age and with her being a fall risk. ? ?Dr. Sherene Sires, please advise on this for pt and daughter. ?

## 2022-01-29 NOTE — Telephone Encounter (Signed)
Probably intermittently aspirating so ok to try omnicef 300 mg bid x 7 days but really nothing else to offer s ov  ?

## 2022-01-29 NOTE — Telephone Encounter (Signed)
Called and spoke with patient's daughter. She verbalized understanding. RX has been sent to [facility] as requested.  ? ?Nothing further needed at time of call.  ?

## 2022-01-31 ENCOUNTER — Encounter: Payer: Self-pay | Admitting: Nurse Practitioner

## 2022-01-31 ENCOUNTER — Non-Acute Institutional Stay: Payer: Medicare Other | Admitting: Nurse Practitioner

## 2022-01-31 DIAGNOSIS — R0602 Shortness of breath: Secondary | ICD-10-CM

## 2022-01-31 DIAGNOSIS — F0280 Dementia in other diseases classified elsewhere without behavioral disturbance: Secondary | ICD-10-CM

## 2022-01-31 DIAGNOSIS — Z515 Encounter for palliative care: Secondary | ICD-10-CM

## 2022-01-31 DIAGNOSIS — R630 Anorexia: Secondary | ICD-10-CM

## 2022-01-31 NOTE — Progress Notes (Signed)
? ? ?Manufacturing engineer ?Community Palliative Care Consult Note ?Telephone: [phone]  ?Fax: [phone]  ? ? ?Date of encounter: 01/31/22 ?8:09 PM ?PATIENT NAME: Carol Beck ?[address] ?[place] [address]   ?[phone] (home)  ?DOB: [birth year] ?MRN: [id number] ?PRIMARY CARE PROVIDER:    ?[facility] ? ?RESPONSIBLE PARTY:    ?Contact Information   ? ? Name Relation Home Work Mobile  ? Bertram Savin Daughter [phone]    ? Regina Eck Daughter   [phone]  ? ?  ? ?I met face to face with patient in facility. Palliative Care was asked to follow this patient by consultation request of Blenda Peals to address advance care planning and complex medical decision making. This is a follow up visit.                                  ?ASSESSMENT AND PLAN / RECOMMENDATIONS:  ?Symptom Management/Plan: ?1. Advance Care Planning;  DNR; [facility] at facility ?  ?2. Anorexia secondary to dementia; Monitor weights, continue encourage to eat. Muscle wasting;  ?11/21/2020 weight 125 lbs ?12/03/2021 weight 117.3 llbs ?12/31/2021 weight 120.8 lbs ?4.2 lbs weight loss over 2 months ?3.5 lbs weight gain over last month. ? ?Left arm 23 cm ?Left leg 37 cm ?  ?3. Shortness of breath; not on O2; continue to monitor while eating/drinking with concern for silent aspiration. Monitor respiratory with early interventions for PNA. Continue to follow Pulmonary. ?  ?4. Goals of Care: Goals include to maximize quality of life and symptom management. Our advance care planning conversation included a discussion about:    ?The value and importance of advance care planning  ?Exploration of personal, cultural or spiritual beliefs that might influence medical decisions  ?Exploration of goals of care in the event of a sudden injury or illness  ?Identification and preparation of a healthcare agent  ?Review and updating or creation of an advance directive document. ?  ?5. Palliative care encounter; Palliative care encounter;  Palliative medicine team will continue to support patient, patient's family, and medical team. Visit consisted of counseling and education dealing with the complex and emotionally intense issues of symptom management and palliative care in the setting of serious and potentially life-threatening illness ? ?Follow up Palliative Care Visit: Palliative care will continue to follow for complex medical decision making, advance care planning, and clarification of goals. Return 1 weeks or prn. ? ?I spent 36 minutes providing this consultation starting at 12:30pm. More than 50% of the time in this consultation was spent in counseling and care coordination. ?PPS: 40% ? ?Chief Complaint: Follow up palliative consult for complex medical decision making ? ?HISTORY OF PRESENT ILLNESS:  Carol Beck is a 88 y.o. year old female  with multiple medical problems including Vascular dementia, CAD, HTN, heart murmur, carotid stenosis, HLD, hypothyroidism, RLS, depression, anxiety with delusions. Carol Beck resides at [facility]. Carol Beck does ambulate with a walker, currently working with physical and speech therapy. Carol Beck does require assistance with ADL's including bathing, dressing. She toilets herself. Carol Beck does feed herself with varied appetite. Staff endorses cognitive impairment recent memory, recall, though able to remember remote. Staff endorses recurrent PNA and currently being treated with abx. At present Carol Beck is sitting in the dining hall feeding herself, she appears thin, comfortable. Carol Beck did make eye contact, was able to answer simple questions but limited with cognitive impairment.  Carol Beck was observed eating, no coughing or choking during observation. Emotional support provided. Carol Beck was cooperative with assessment. Will contact provider, Olivia Mackie for further discussion of progression of chronic disease, recurrent pna's and weight gain, call family and also have hospice physicians  evaluate to see if eligible for possible services. Emotional support provided. I updated staff.  ? ?History obtained from review of EMR, facility staff and Ms. Folden.  ?I reviewed available labs, medications, imaging, studies and related documents from the EMR.  Records reviewed and summarized above.  ? ?ROS ?10 point system all negative except HPI ? ?Physical Exam: ?Constitutional: NAD ?General: frail appearing, thin, cognitively impaired female ?EYES: lids intact ?ENMT: oral mucous membranes moist ?CV: S1S2, RRR ?Pulmonary: crackles bases, decreased, no increased work of breathing, + cough, room air ?Abdomen: normo-active BS + 4 quadrants, soft and non tender ?MSK: ambulatory with walker; +muscle wasting  ?Skin: warm and dry ?Neuro:  no generalized weakness,  + cognitive impairment ?Psych: non-anxious affect, A and Oriented to self ?Thank you for the opportunity to participate in the care of Carol Beck.  The palliative care team will continue to follow. Please call our office at [phone] if we can be of additional assistance.  ? ? Z , NP ? ?

## 2022-02-12 ENCOUNTER — Telehealth: Payer: Self-pay | Admitting: Nurse Practitioner

## 2022-02-12 NOTE — Telephone Encounter (Signed)
Received a call from Carol Beck, Carol Beck daughter that overall decline requesting visit for today. We talked about was ambulating with walker 3 weeks ago now it takes staff to help her out of the bed, PPS 50%; now 30%; total care ADL's bathing, dressing, incontinence episodes. Family said over the weekend, has become very weak, unable to get out of bed, increase in confusion, not knowing family when last week she knew her daughters. Significant decline since 2 weeks ago when I saw her. Recurrent aspiration PNA every 3 to 4 weeks over last 4 months despite multiple abx without improvement, currently constant wheezing ?Cachectic with muscle wasting, atrophy, sunken cheeks, not eating the last 4 days ?Reviewed: 11/21/2020 weight 125 lbs ?12/03/2021 weight 117.3 llbs ?12/31/2021 weight 120.8 lbs ?We talked about option of hospice benefit through medicare program. 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 Hospice Physicians in agreement to schedule AV visit. I called Delphia Grates NP at [facility], updated and order received to proceed with hospice.  ? ?Total time 30 minutes ?Documentation 5 minutes ?Phone discussion 25 minutes ? ? ? ?

## 2022-02-13 ENCOUNTER — Telehealth: Payer: Self-pay | Admitting: Internal Medicine

## 2022-02-13 NOTE — Telephone Encounter (Signed)
Spoke with the pt's daughter  ?She states the staff at pt's nursing home noticed her wheezing yesterday and they ordered cxr  ?She was told she had an infiltrate and they ordered zpack  ?Pt started this today  ?Daughter making sure MW okay with this plan  ?Dr Sherene Sires, please advise, thanks! ? ?Allergies  ?Allergen Reactions  ? Evista [Raloxifene] Other (See Comments)  ?  Reaction: unknown  ? Shellfish Allergy Other (See Comments)  ?  Reaction: unknown   ? Sulfa Antibiotics Other (See Comments)  ?  Reaction: unknown  ? ? ?

## 2022-02-13 NOTE — Telephone Encounter (Signed)
That sounds like a good start ?

## 2022-02-13 NOTE — Telephone Encounter (Signed)
Called and spoke with patient's daughter Victorino Dike. She verbalized understanding.  ? ?Nothing further needed at time of call.  ?

## 2022-02-14 IMAGING — CR DG CHEST 1V PORT
1 series · 1 of 1 positions shown · non-contrast
Comparison: 07/01/2020 and earlier.

CLINICAL DATA: [AGE] who fell at the [HOSPITAL] earlier
today.

EXAM:
PORTABLE CHEST 1 VIEW

[dg chest port 1 view]
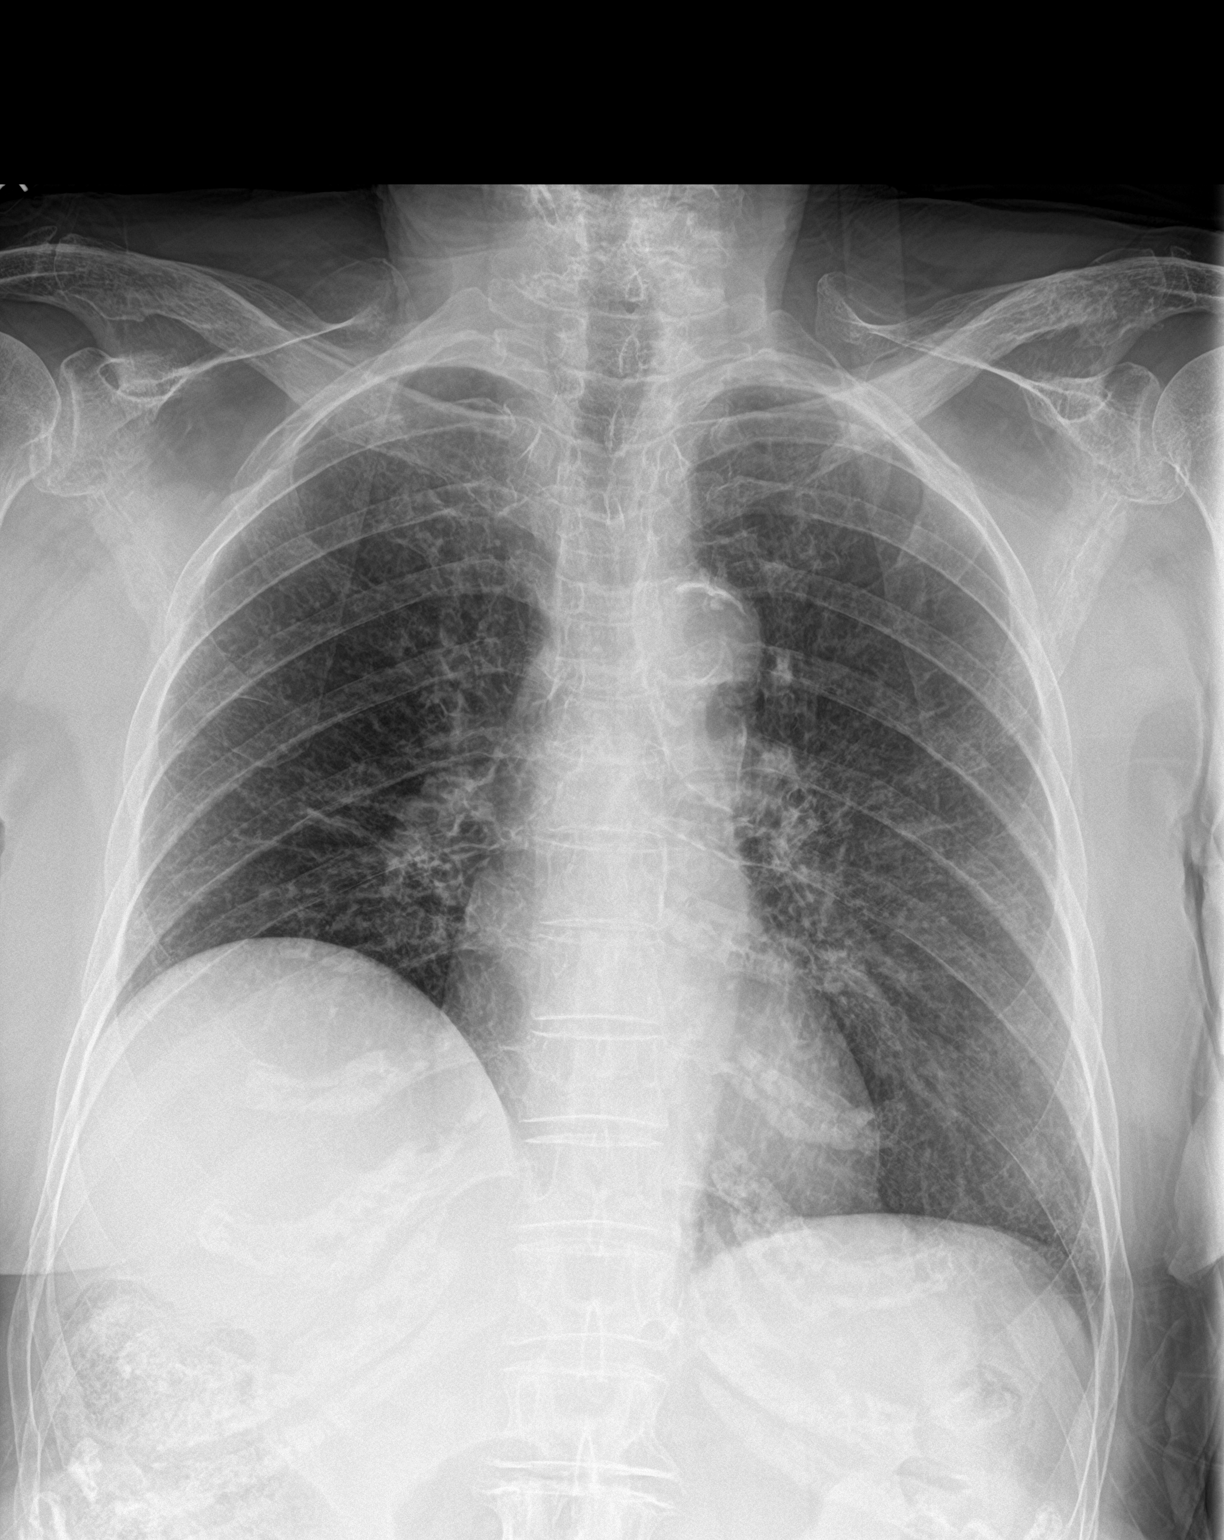

[1 of 1 positions shown; findings below may reference images not displayed]

FINDINGS: Cardiac silhouette normal in size, unchanged. Thoracic aorta
atherosclerotic. Hilar and mediastinal contours otherwise
unremarkable. Calcified tracheobronchial cartilages. Scattered areas
of linear scarring throughout both lungs, unchanged. Lungs otherwise
clear. Pulmonary vascularity normal. No pleural effusions. Stable
chronic elevation of the RIGHT hemidiaphragm.
IMPRESSION: 1.  No acute cardiopulmonary disease.
2.  Aortic Atherosclerosis ([id number])

## 2022-02-16 ENCOUNTER — Other Ambulatory Visit: Payer: Medicare Other | Admitting: Nurse Practitioner

## 2022-02-16 ENCOUNTER — Encounter: Payer: Self-pay | Admitting: Nurse Practitioner

## 2022-02-16 DIAGNOSIS — R0602 Shortness of breath: Secondary | ICD-10-CM

## 2022-02-16 DIAGNOSIS — Z515 Encounter for palliative care: Secondary | ICD-10-CM

## 2022-02-16 DIAGNOSIS — F0280 Dementia in other diseases classified elsewhere without behavioral disturbance: Secondary | ICD-10-CM

## 2022-02-16 DIAGNOSIS — R630 Anorexia: Secondary | ICD-10-CM

## 2022-02-16 NOTE — Progress Notes (Signed)
? ? ?Manufacturing engineer ?Community Palliative Care Consult Note ?Telephone: [phone]  ?Fax: [phone]  ? ? ?Date of encounter: 02/16/22 ?11:10 AM ?PATIENT NAME: Carol Beck ?[address] ?[place] [address]   ?[phone] (home)  ?DOB: 86/13/1928 ?MRN: [id number] ?PRIMARY CARE PROVIDER:    ?Carol Kitchens NP Carol Beck ? ?RESPONSIBLE PARTY:    ?Contact Information   ? ? Name Relation Home Work Mobile  ? Carol Beck Daughter [phone]    ? Carol Beck Daughter   [phone]  ? ?  ? ?I met face to face with patient and family in facility. Palliative Care was asked to follow this patient by consultation request of  Carol Kitchens NP to address advance care planning and complex medical decision making. This is a follow up visit                                 ?ASSESSMENT AND PLAN / RECOMMENDATIONS:  ?Symptom Management/Plan: ?1. Advance Care Planning;  DNR; [facility] at facility;  ?Will have hospice re-visit eligibility under cerebral arthrosclerosis with worsening dysphagia.  ?  ?2. Anorexia secondary to dementia; Monitor weights, continue encourage to eat. Muscle wasting;  ?12/22/2020 weight 125 lbs ?12/03/2021 weight 117.3 llbs ?01/17/2022 ?Left arm 23 cm ?Left leg 37 cm ? ?Today  ?Left arm 23 cm ?Left leg 37 cm ?Weight today 120.3 lbs ?  ?3. Shortness of breath; not on O2; continue to monitor while eating/drinking with concern for silent aspiration. Monitor respiratory; CXR done 02/12/2022 which showed PNA with atelectases per Carol Beck. Walked Carol Beck with pulse ox, started at 98%, with ambulation with walker, unsteady gait walked 6 to 8 steps with O2 sats decreasing to 93%. Carol Beck appears sob, wheezing at the time. Staff endorses Carol Beck does continuously wheeze, coughing observed when eating. Staff endorses and observed Carol Beck was unable to pull her pants up when measured leg, with incontinence of urine wears an adult diaper.  ?  ?4. Palliative care encounter;  Palliative care encounter; Palliative medicine team will continue to support patient, patient's family, and medical team. Visit consisted of counseling and education dealing with the complex and emotionally intense issues of symptom management and palliative care in the setting of serious and potentially life-threatening illness ? ?Follow up Palliative Care Visit: Palliative care will continue to follow for complex medical decision making, advance care planning, and clarification of goals. Return 2 weeks or prn. ? ?I spent 65 minutes providing this consultation. More than 50% of the time in this consultation was spent in counseling and care coordination ? ?PPS: 40% ?Chief Complaint: Follow up palliative consult for complex medical decision making ? ?HISTORY OF PRESENT ILLNESS:  Carol Beck is a 88 y.o. year old female with multiple medical problems including Vascular dementia, CAD, HTN, heart murmur, carotid stenosis, HLD, hypothyroidism, RLS, depression, anxiety with delusions. Carol Beck resides at [facility]. I visited and observed Carol Beck she was lying in bed, required assisting to standing position to sit in the chair. Carol Beck repeatedly asked the same question, smiling, pleasant. Carol Beck was cooperative with assessment, Limited verbal discussion with cognitive impairment. Emotional support provided.  ? ?I met with Carol Baller, Ms Beck daughter separately with sister Carol Beck on conference call. Update discussed. We talked about decline overall, noted decrease in O2 sats, recurrent PNA's, aspiration with eating, drinking, continuous  wheezing, shortness of breath with ambulating, unsteady gait unless  using walker. We talked about medical goals. We talked hospice non-admit. We talked about Medicare criteria. We talked about recurrent PNA and family requested for re-evaluation for Hospice eligibility. Discussed with Dr Konrad Dolores with approval to request a second AV visit. Message left for primary  as can use same order since less than 2 weeks from initiation of order. Notified hospice  ? ?Carol Beck. We talked about pc, past medical history, events which lead up to placement at ALF. We talked about ros, symptoms, cognitive and functional decline. We talked about Carol Beck working with PT and speech. We talked about Pulmonary consult. We talked about 06/2021 mold exposure; 07/2021 PNA with abx; 08/2021 PNA with abx; 09/2021 congestion on xray; 10/2021 congestion on xray. Concern for silent aspiration.  ? ?History obtained from review of EMR, discussion with primary team, and interview with family, facility staff/caregiver and/or Carol Beck.  ?I reviewed available labs, medications, imaging, studies and related documents from the EMR.  Records reviewed and summarized above.  ? ?ROS ?10 point system reviewed with staff as Carol Beck is cognitively impaired; +cough, +wheezing, +shortness of breath, +generalized weakness ? ?Physical Exam: ?Constitutional: NAD ?General: frail appearing, thin, cognitively impaired pleasant female ?EYES: anicteric sclera, lids intact  ?ENMT: oral mucous membranes moist ?CV: S1S2, RRR ?Pulmonary: +scattered rhonchi, decrease bases with few crackles, + increased work of breathing with ambulating, + cough, room air ?Abdomen: intake staff endorses <50% 3 meals a day; normo-active BS + 4 quadrants, soft and non tender ?MSK: ambulatory with walker, without walker unstable gait; +muscle wasting  ?Skin: warm and dry ?Neuro:  + generalized weakness,  + cognitive impairment ?Psych: non-anxious affect, A and Oriented to self ?Thank you for the opportunity to participate in the care of Carol Beck.  The palliative care team will continue to follow. Please call our office at [phone] if we can be of additional assistance.  ? ?Kathleen Tamm Z Judianne Seiple, NP  ?   ?

## 2022-04-12 IMAGING — CR DG HUMERUS 2V *L*
3 series · 3 of 3 positions shown · non-contrast
Comparison: Left shoulder radiographs obtained at the same time.

CLINICAL DATA: Left upper arm bruising.  No known cause.

EXAM:
LEFT HUMERUS - 2+ VIEW

[dg humerus left]
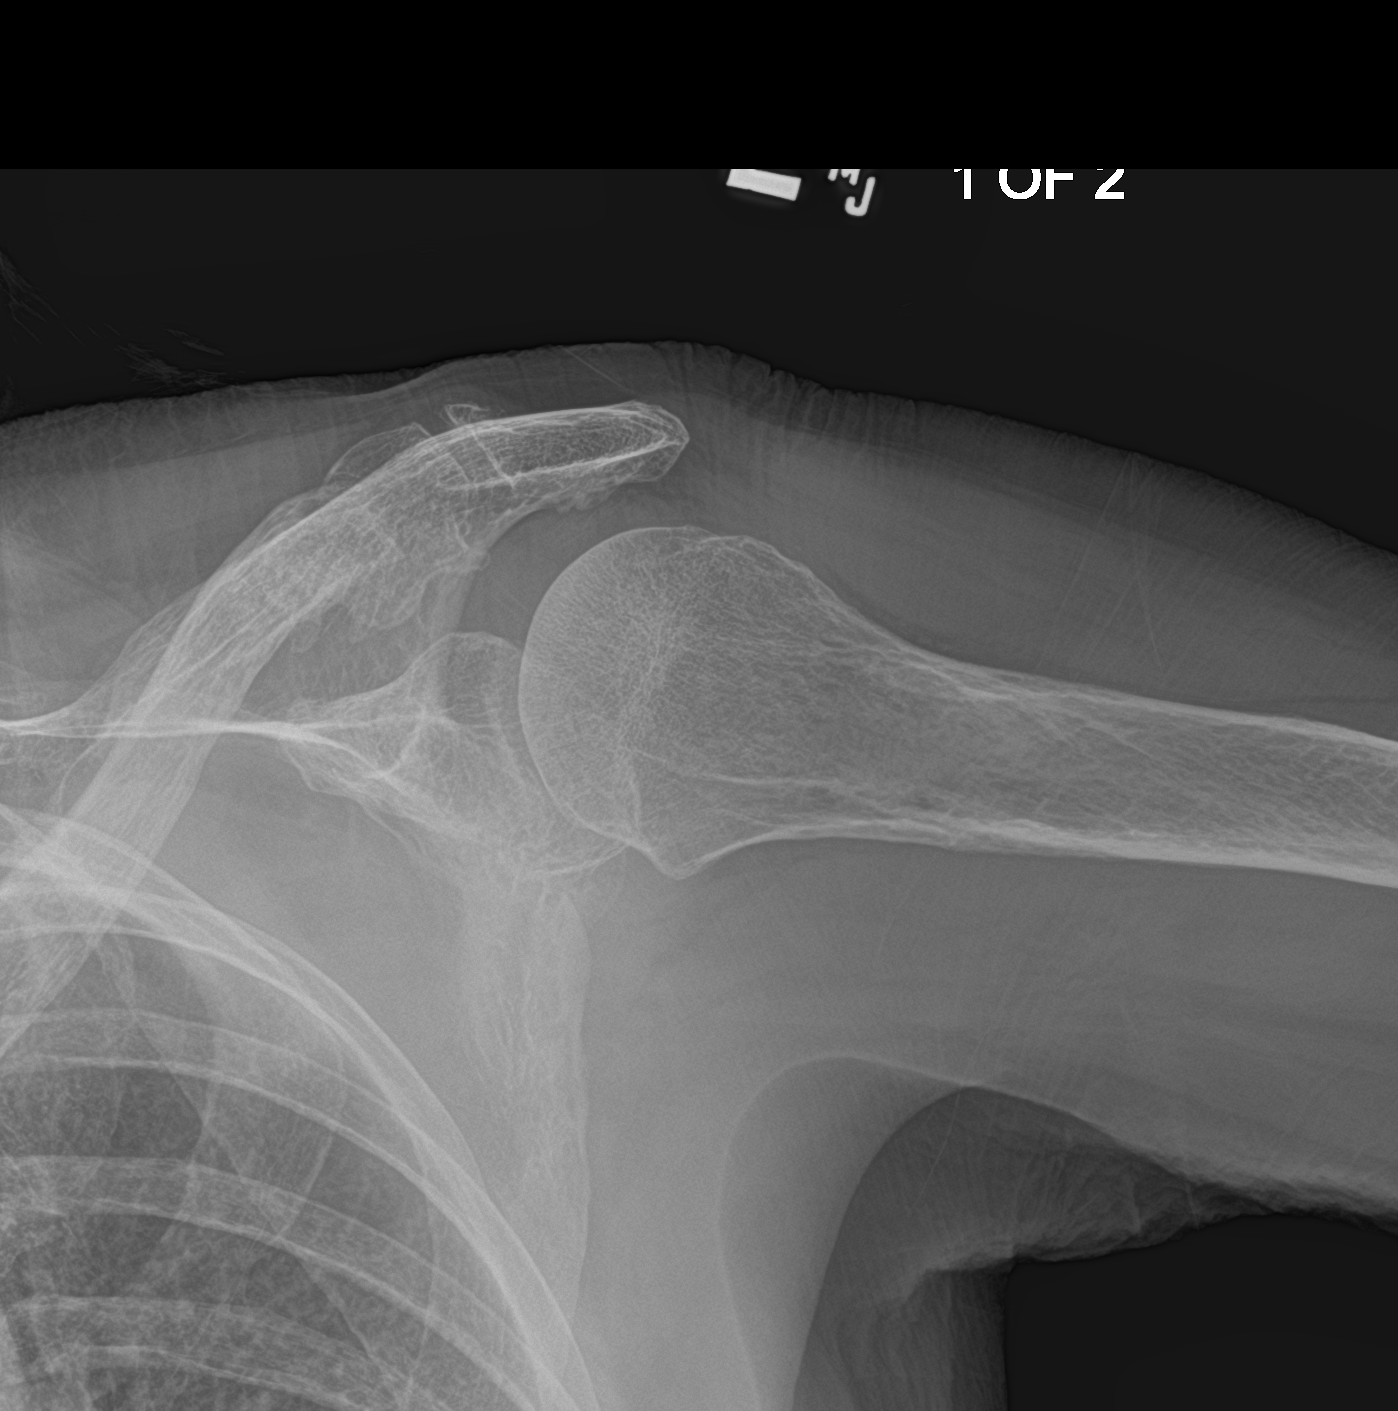

[humerus ap]
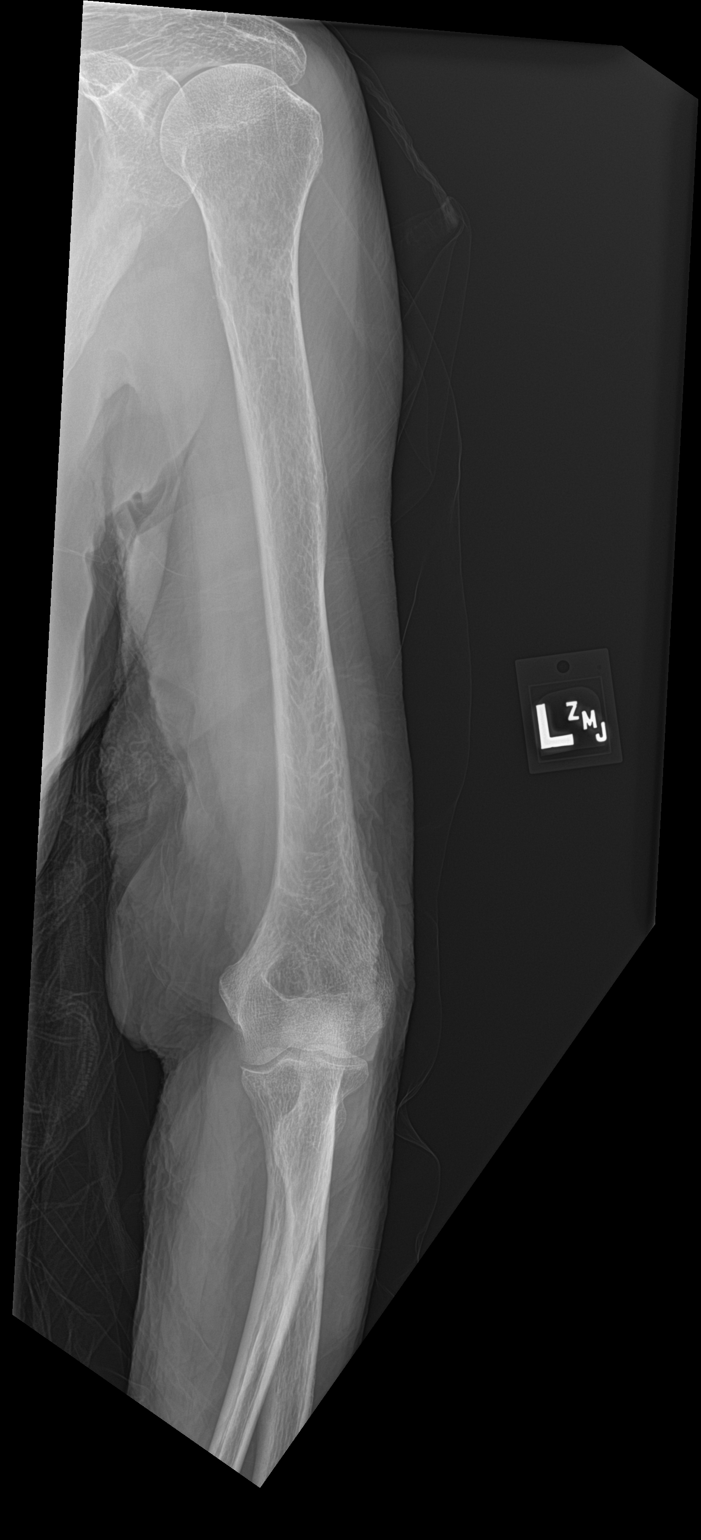

[humerus lat]
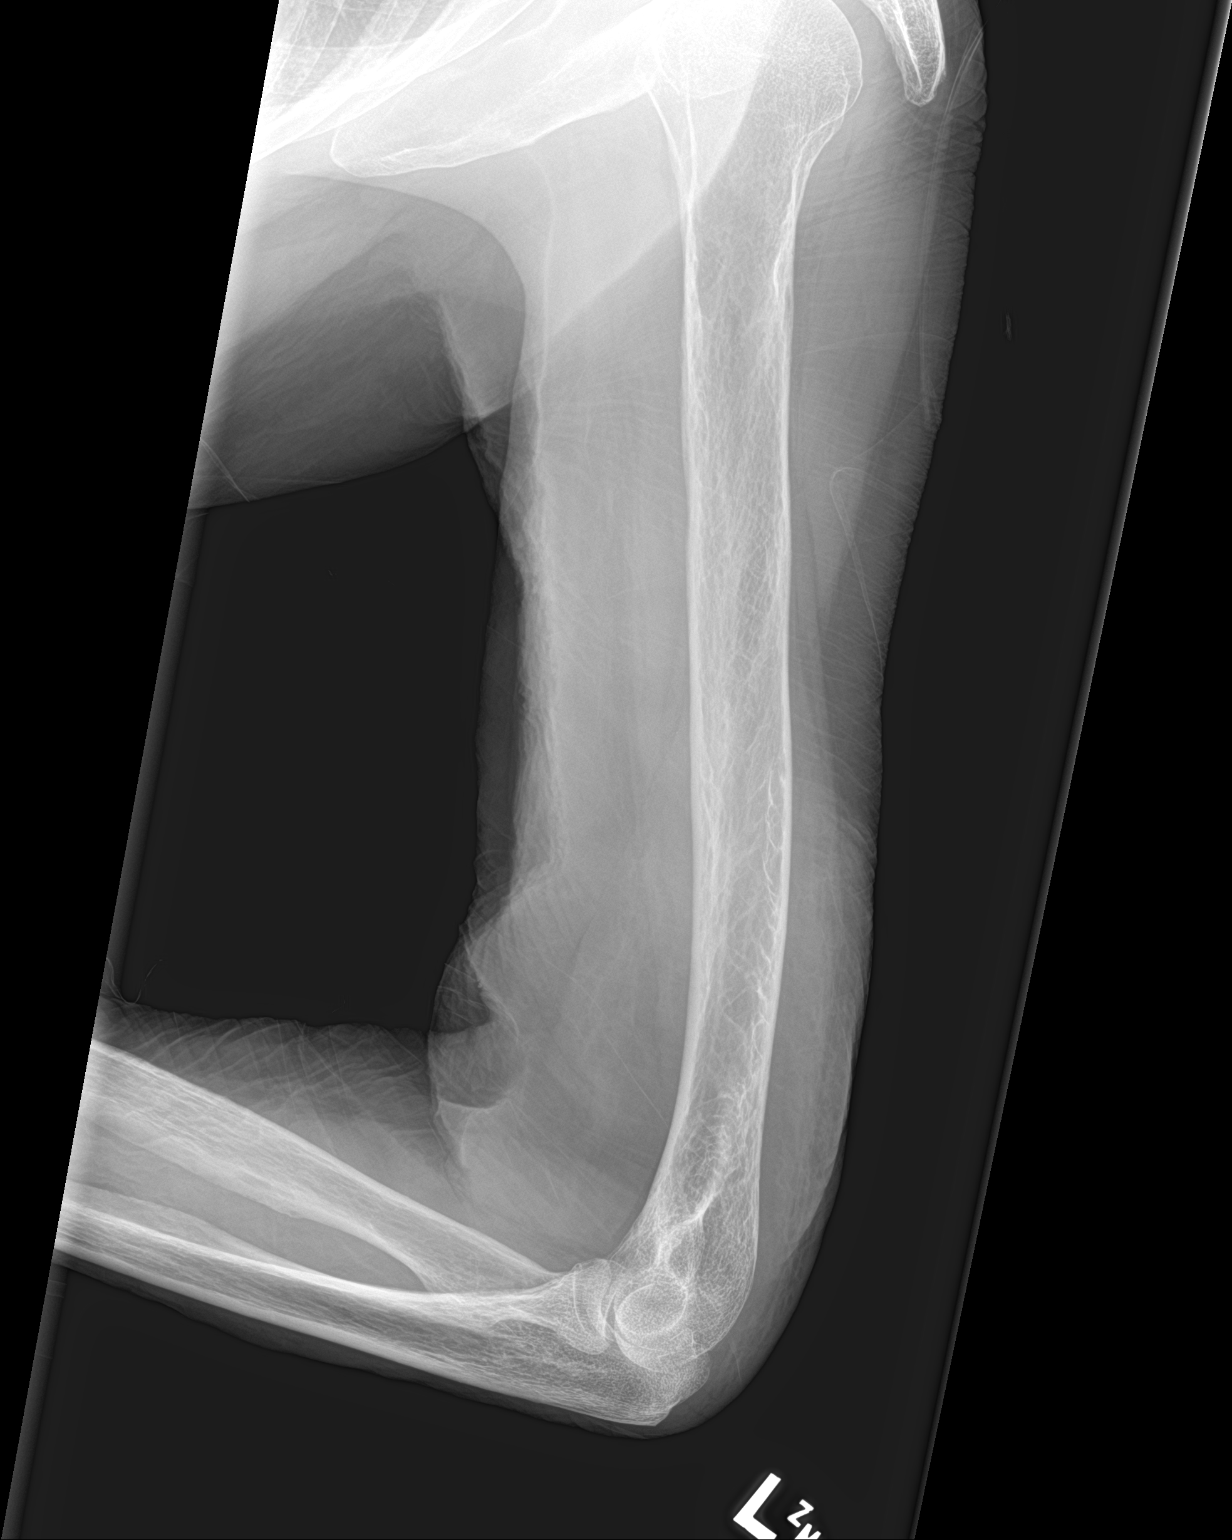

[3 of 3 positions shown; findings below may reference images not displayed]

FINDINGS: Normal appearing left humerus other than diffuse osteopenia. No
fracture or dislocation.
IMPRESSION: No fracture.

## 2022-04-24 ENCOUNTER — Other Ambulatory Visit: Payer: Self-pay | Admitting: Internal Medicine

## 2022-07-18 ENCOUNTER — Telehealth: Payer: Self-pay | Admitting: Internal Medicine

## 2022-07-18 NOTE — Telephone Encounter (Signed)
Daughter called to inform the nurse that her mother is having increased aspirations, is in hospice and wanted to know about medications and what to expect.  Please call daughter Anderson Malta) to discuss.  CB# [phone]

## 2022-07-18 NOTE — Telephone Encounter (Signed)
Left message for daughter to call back.  

## 2023-08-30 DEATH — deceased
